# Patient Record
Sex: Female | Born: 1940
Health system: Southern US, Community
[De-identification: ages and names within clinical notes are randomized; demographics above are authoritative.]

## PROBLEM LIST (undated history)

## (undated) DIAGNOSIS — E785 Hyperlipidemia, unspecified: Secondary | ICD-10-CM

## (undated) DIAGNOSIS — I493 Ventricular premature depolarization: Secondary | ICD-10-CM

## (undated) DIAGNOSIS — K219 Gastro-esophageal reflux disease without esophagitis: Secondary | ICD-10-CM

## (undated) DIAGNOSIS — R9431 Abnormal electrocardiogram [ECG] [EKG]: Secondary | ICD-10-CM

## (undated) DIAGNOSIS — M8580 Other specified disorders of bone density and structure, unspecified site: Secondary | ICD-10-CM

## (undated) DIAGNOSIS — J45909 Unspecified asthma, uncomplicated: Secondary | ICD-10-CM

## (undated) DIAGNOSIS — I452 Bifascicular block: Secondary | ICD-10-CM

## (undated) DIAGNOSIS — G4700 Insomnia, unspecified: Secondary | ICD-10-CM

## (undated) DIAGNOSIS — F419 Anxiety disorder, unspecified: Secondary | ICD-10-CM

## (undated) HISTORY — DX: Insomnia, unspecified: G47.00

## (undated) HISTORY — DX: Ventricular premature depolarization: I49.3

## (undated) HISTORY — DX: Unspecified asthma, uncomplicated: J45.909

## (undated) HISTORY — DX: Anxiety disorder, unspecified: F41.9

## (undated) HISTORY — DX: Other specified disorders of bone density and structure, unspecified site: M85.80

## (undated) HISTORY — DX: Abnormal electrocardiogram (ECG) (EKG): R94.31

## (undated) HISTORY — DX: Gastro-esophageal reflux disease without esophagitis: K21.9

## (undated) HISTORY — DX: Hyperlipidemia, unspecified: E78.5

## (undated) HISTORY — DX: Bifascicular block: I45.2

## (undated) HISTORY — PX: TUBAL LIGATION: SHX77

---

## 2004-09-21 ENCOUNTER — Other Ambulatory Visit: Admission: RE | Admit: 2004-09-21 | Discharge: 2004-09-21 | Payer: Self-pay | Admitting: Family Medicine

## 2004-09-24 ENCOUNTER — Ambulatory Visit (HOSPITAL_COMMUNITY): Admission: RE | Admit: 2004-09-24 | Discharge: 2004-09-24 | Payer: Self-pay | Admitting: Family Medicine

## 2006-06-05 ENCOUNTER — Other Ambulatory Visit: Admission: RE | Admit: 2006-06-05 | Discharge: 2006-06-05 | Payer: Self-pay | Admitting: Family Medicine

## 2007-08-06 ENCOUNTER — Other Ambulatory Visit: Admission: RE | Admit: 2007-08-06 | Discharge: 2007-08-06 | Payer: Self-pay | Admitting: Family Medicine

## 2009-09-23 ENCOUNTER — Other Ambulatory Visit: Admission: RE | Admit: 2009-09-23 | Discharge: 2009-09-23 | Payer: Self-pay | Admitting: Family Medicine

## 2010-09-25 ENCOUNTER — Encounter: Payer: Self-pay | Admitting: Family Medicine

## 2011-09-17 DIAGNOSIS — Z23 Encounter for immunization: Secondary | ICD-10-CM | POA: Diagnosis not present

## 2011-09-20 DIAGNOSIS — Z1231 Encounter for screening mammogram for malignant neoplasm of breast: Secondary | ICD-10-CM | POA: Diagnosis not present

## 2012-09-17 DIAGNOSIS — M810 Age-related osteoporosis without current pathological fracture: Secondary | ICD-10-CM | POA: Diagnosis not present

## 2012-09-17 DIAGNOSIS — Z23 Encounter for immunization: Secondary | ICD-10-CM | POA: Diagnosis not present

## 2012-09-17 DIAGNOSIS — K589 Irritable bowel syndrome without diarrhea: Secondary | ICD-10-CM | POA: Diagnosis not present

## 2012-09-17 DIAGNOSIS — F411 Generalized anxiety disorder: Secondary | ICD-10-CM | POA: Diagnosis not present

## 2012-09-17 DIAGNOSIS — I4949 Other premature depolarization: Secondary | ICD-10-CM | POA: Diagnosis not present

## 2012-09-17 DIAGNOSIS — Z0000 Encounter for general adult medical examination without abnormal findings: Secondary | ICD-10-CM | POA: Diagnosis not present

## 2012-09-17 DIAGNOSIS — E7800 Pure hypercholesterolemia, unspecified: Secondary | ICD-10-CM | POA: Diagnosis not present

## 2012-09-18 DIAGNOSIS — M810 Age-related osteoporosis without current pathological fracture: Secondary | ICD-10-CM | POA: Diagnosis not present

## 2012-09-21 DIAGNOSIS — Z1231 Encounter for screening mammogram for malignant neoplasm of breast: Secondary | ICD-10-CM | POA: Diagnosis not present

## 2013-07-26 DIAGNOSIS — R002 Palpitations: Secondary | ICD-10-CM | POA: Diagnosis not present

## 2013-08-09 ENCOUNTER — Encounter: Payer: Self-pay | Admitting: Cardiology

## 2013-08-21 ENCOUNTER — Encounter: Payer: Self-pay | Admitting: Cardiology

## 2013-08-21 ENCOUNTER — Ambulatory Visit (INDEPENDENT_AMBULATORY_CARE_PROVIDER_SITE_OTHER): Payer: Medicare Other | Admitting: Cardiology

## 2013-08-21 VITALS — BP 125/50 | HR 82 | Ht 66.5 in | Wt 133.8 lb

## 2013-08-21 DIAGNOSIS — E785 Hyperlipidemia, unspecified: Secondary | ICD-10-CM | POA: Insufficient documentation

## 2013-08-21 DIAGNOSIS — I493 Ventricular premature depolarization: Secondary | ICD-10-CM | POA: Insufficient documentation

## 2013-08-21 DIAGNOSIS — I452 Bifascicular block: Secondary | ICD-10-CM

## 2013-08-21 DIAGNOSIS — F411 Generalized anxiety disorder: Secondary | ICD-10-CM | POA: Diagnosis not present

## 2013-08-21 DIAGNOSIS — I4949 Other premature depolarization: Secondary | ICD-10-CM | POA: Diagnosis not present

## 2013-08-21 DIAGNOSIS — F419 Anxiety disorder, unspecified: Secondary | ICD-10-CM

## 2013-08-21 MED ORDER — DILTIAZEM HCL 30 MG PO TABS: 30.0000 mg | ORAL_TABLET | Freq: Four times a day (QID) | ORAL | Status: DC | PRN

## 2013-08-21 NOTE — Progress Notes (Signed)
[address] [place], [place]  [address] Phone: [phone] Fax:  [phone]  Date:  08/21/2013   ID:  Kirsten Nash, DOB 05-09-1941, MRN [id number]  PCP:  Astrid Divine, MD   History of Present Illness: Kirsten Nash is a 72 y.o. female formally seen in 2011 here for evaluation of palpitations. She's been having more frequent palpitations which are occurring fairly constantly with no associated chest pain or syncope. In the past, she was worked up and diagnosed with PVCs and was on low-dose metoprolol. She stopped this medication because she could not tolerate it do to drop in her blood pressure.  Previous EKG demonstrated right bundle branch block, left anterior fascicular block, sinus rhythm. PVCs were noted. Previous echo/nuclear stress test were unremarkable. Stress test was done in December 2007.  Mother had myocardial infarction at age 67. Still living at 91.    Wt Readings from Last 3 Encounters:  08/21/13 133 lb 12.8 oz (60.691 kg)     Past Medical History  Diagnosis Date  . History of IBS   . Anxiety   . Insomnia   . Hyperlipidemia   . Osteopenia   . GERD (gastroesophageal reflux disease)   . PVC's (premature ventricular contractions)   . Allergy-induced asthma   . Abnormal EKG     ,normal cardiolyte   . RBBB (right bundle branch block with left anterior fascicular block)     No past surgical history on file.  Current Outpatient Prescriptions  Medication Sig Dispense Refill  . CALCIUM PO Take by mouth.      . Cholecalciferol (VITAMIN D PO) Take by mouth.      Marland Kitchen LORazepam (ATIVAN) 0.5 MG tablet Take 0.5 mg by mouth every 8 (eight) hours.      . Multiple Vitamin (MULTIVITAMIN) tablet Take 1 tablet by mouth daily.      . Red Yeast Rice Extract (RED YEAST RICE PO) Take by mouth.      . zolpidem (AMBIEN) 5 MG tablet Take 5 mg by mouth at bedtime as needed for sleep.       No current facility-administered medications  for this visit.    Allergies:    Allergies  Allergen Reactions  . Codeine Nausea Only    Social History:  The patient  reports that she has never smoked. She does not have any smokeless tobacco history on file. She reports that she drinks alcohol. She reports that she does not use illicit drugs.   Family History  Problem Relation Age of Onset  . Heart attack Mother 61    In her early 63's    ROS:  Please see the history of present illness.   Denies any fevers, syncope, bleeding, orthopnea, PND, rash.   All other systems reviewed and negative.   PHYSICAL EXAM: VS:  BP 125/50  Pulse 82  Ht 5' 6.5" (1.689 m)  Wt 133 lb 12.8 oz (60.691 kg)  BMI 21.27 kg/m2 Well nourished, well developed, in no acute distress HEENT: normal, [place]/AT, EOMI Neck: no JVD, normal carotid upstroke, no bruit Cardiac:  normal S1, S2; RRR; no murmur Lungs:  clear to auscultation bilaterally, no wheezing, rhonchi or rales Abd: soft, nontender, no hepatomegaly, no bruits Ext: no edema, 2+ distal pulses Skin: warm and dry GU: deferred Neuro: no focal abnormalities noted, AAO x 3  EKG:  Incomplete Right bundle branch block, left anterior fascicular block, sinus rhythm, 82bpm  ASSESSMENT AND PLAN:  1. Palpitations/right bundle branch block/left anterior fascicular block-in the past she has not tolerate metoprolol due to hypotension. Seems to skip every four or five beats. More common in the evening. Trying to stay away from caffiene. No CP. No Syncope. Most likely PVCs as previously diagnosed. She is concerned because her family member had a stroke and she had atrial fibrillation. I will check a 24-hour Holter monitor to ensure that there no adverse arrhythmias. We will see her back in 2 months after initiating medication. She can take diltiazem 30 mg by mouth every 6 hours, short acting. She is going to be having blood work done in January for yearly physical with Dr. Valentina Lucks.  Signed, Donato Schultz, MD [facility]    08/21/2013 10:21 AM

## 2013-08-21 NOTE — Patient Instructions (Signed)
Your physician has recommended you make the following change in your medication:   1. Start Diltiazem HCL 30 mg  Every 6 hours as needed for palpitations.  Your physician has recommended that you wear a 24 hr. holter monitor. Holter monitors are medical devices that record the heart's electrical activity. Doctors most often use these monitors to diagnose arrhythmias. Arrhythmias are problems with the speed or rhythm of the heartbeat. The monitor is a small, portable device. You can wear one while you do your normal daily activities. This is usually used to diagnose what is causing palpitations/syncope (passing out).  Your physician recommends that you schedule a follow-up appointment in: 2 months with Dr. Anne Fu.

## 2013-08-26 ENCOUNTER — Encounter (INDEPENDENT_AMBULATORY_CARE_PROVIDER_SITE_OTHER): Payer: Medicare Other

## 2013-08-26 ENCOUNTER — Encounter: Payer: Self-pay | Admitting: *Deleted

## 2013-08-26 DIAGNOSIS — R002 Palpitations: Secondary | ICD-10-CM

## 2013-08-26 DIAGNOSIS — I493 Ventricular premature depolarization: Secondary | ICD-10-CM

## 2013-08-26 NOTE — Progress Notes (Signed)
Patient ID: Kirsten Nash, female   DOB: [birth year], 72 y.o.   MRN: [id number] E-Cardio 24 hour holter monitor applied to patient.

## 2013-09-13 ENCOUNTER — Telehealth: Payer: Self-pay | Admitting: Cardiology

## 2013-09-13 NOTE — Telephone Encounter (Signed)
Monitor results. 

## 2013-09-13 NOTE — Telephone Encounter (Signed)
OK to stop diltiazem if it is causing HA. Let's restart metoprolol ER 25mg  PO QD. She does not need to keep 2/15 appt. Sched a 1 year follow up.

## 2013-09-18 ENCOUNTER — Telehealth: Payer: Self-pay | Admitting: Cardiology

## 2013-09-18 MED ORDER — METOPROLOL SUCCINATE ER 25 MG PO TB24: 25.0000 mg | ORAL_TABLET | Freq: Every day | ORAL | Status: DC

## 2013-09-18 NOTE — Telephone Encounter (Signed)
error 

## 2013-09-20 DIAGNOSIS — F411 Generalized anxiety disorder: Secondary | ICD-10-CM | POA: Diagnosis not present

## 2013-09-20 DIAGNOSIS — Z0000 Encounter for general adult medical examination without abnormal findings: Secondary | ICD-10-CM | POA: Diagnosis not present

## 2013-09-20 DIAGNOSIS — I4949 Other premature depolarization: Secondary | ICD-10-CM | POA: Diagnosis not present

## 2013-09-20 DIAGNOSIS — E7800 Pure hypercholesterolemia, unspecified: Secondary | ICD-10-CM | POA: Diagnosis not present

## 2013-09-20 DIAGNOSIS — M810 Age-related osteoporosis without current pathological fracture: Secondary | ICD-10-CM | POA: Diagnosis not present

## 2013-09-20 DIAGNOSIS — K589 Irritable bowel syndrome without diarrhea: Secondary | ICD-10-CM | POA: Diagnosis not present

## 2013-09-23 DIAGNOSIS — Z1231 Encounter for screening mammogram for malignant neoplasm of breast: Secondary | ICD-10-CM | POA: Diagnosis not present

## 2013-10-15 ENCOUNTER — Telehealth: Payer: Self-pay | Admitting: Cardiology

## 2013-10-15 NOTE — Telephone Encounter (Signed)
New message           Pt would like to know if she still needs to come in to be seen on 10/22/13? Thanks

## 2013-10-22 ENCOUNTER — Ambulatory Visit: Payer: Medicare Other | Admitting: Cardiology

## 2013-11-01 ENCOUNTER — Ambulatory Visit: Payer: Medicare Other | Admitting: Cardiology

## 2013-11-13 DIAGNOSIS — N952 Postmenopausal atrophic vaginitis: Secondary | ICD-10-CM | POA: Diagnosis not present

## 2013-11-13 DIAGNOSIS — N950 Postmenopausal bleeding: Secondary | ICD-10-CM | POA: Diagnosis not present

## 2013-11-19 DIAGNOSIS — N950 Postmenopausal bleeding: Secondary | ICD-10-CM | POA: Diagnosis not present

## 2013-11-23 ENCOUNTER — Encounter: Payer: Self-pay | Admitting: *Deleted

## 2014-03-05 DIAGNOSIS — H47329 Drusen of optic disc, unspecified eye: Secondary | ICD-10-CM | POA: Diagnosis not present

## 2014-03-05 DIAGNOSIS — H524 Presbyopia: Secondary | ICD-10-CM | POA: Diagnosis not present

## 2014-07-25 DIAGNOSIS — L821 Other seborrheic keratosis: Secondary | ICD-10-CM | POA: Diagnosis not present

## 2014-07-25 DIAGNOSIS — Z419 Encounter for procedure for purposes other than remedying health state, unspecified: Secondary | ICD-10-CM | POA: Diagnosis not present

## 2014-07-25 DIAGNOSIS — L570 Actinic keratosis: Secondary | ICD-10-CM | POA: Diagnosis not present

## 2014-07-28 ENCOUNTER — Emergency Department (HOSPITAL_COMMUNITY): Payer: Medicare Other

## 2014-07-28 ENCOUNTER — Encounter (HOSPITAL_COMMUNITY): Payer: Self-pay

## 2014-07-28 ENCOUNTER — Emergency Department (HOSPITAL_COMMUNITY)
Admission: EM | Admit: 2014-07-28 | Discharge: 2014-07-29 | Disposition: A | Discharge status: Home / Self Care | Payer: Medicare Other | Attending: Emergency Medicine | Admitting: Emergency Medicine

## 2014-07-28 DIAGNOSIS — R55 Syncope and collapse: Secondary | ICD-10-CM | POA: Insufficient documentation

## 2014-07-28 DIAGNOSIS — Z8719 Personal history of other diseases of the digestive system: Secondary | ICD-10-CM | POA: Insufficient documentation

## 2014-07-28 DIAGNOSIS — E785 Hyperlipidemia, unspecified: Secondary | ICD-10-CM | POA: Insufficient documentation

## 2014-07-28 DIAGNOSIS — Z7982 Long term (current) use of aspirin: Secondary | ICD-10-CM | POA: Diagnosis not present

## 2014-07-28 DIAGNOSIS — I499 Cardiac arrhythmia, unspecified: Secondary | ICD-10-CM | POA: Diagnosis not present

## 2014-07-28 DIAGNOSIS — Z79899 Other long term (current) drug therapy: Secondary | ICD-10-CM | POA: Diagnosis not present

## 2014-07-28 DIAGNOSIS — F419 Anxiety disorder, unspecified: Secondary | ICD-10-CM | POA: Diagnosis not present

## 2014-07-28 DIAGNOSIS — M8580 Other specified disorders of bone density and structure, unspecified site: Secondary | ICD-10-CM | POA: Diagnosis not present

## 2014-07-28 DIAGNOSIS — G4700 Insomnia, unspecified: Secondary | ICD-10-CM | POA: Insufficient documentation

## 2014-07-28 DIAGNOSIS — J45909 Unspecified asthma, uncomplicated: Secondary | ICD-10-CM | POA: Diagnosis not present

## 2014-07-28 DIAGNOSIS — R002 Palpitations: Secondary | ICD-10-CM | POA: Diagnosis not present

## 2014-07-28 LAB — BASIC METABOLIC PANEL
Anion gap: 11 (ref 5–15)
BUN: 20 mg/dL (ref 6–23)
CALCIUM: 9.3 mg/dL (ref 8.4–10.5)
CO2: 25 mEq/L (ref 19–32)
Chloride: 104 mEq/L (ref 96–112)
Creatinine, Ser: 0.95 mg/dL (ref 0.50–1.10)
GFR, EST AFRICAN AMERICAN: 68 mL/min — AB (ref >90)
GFR, EST NON AFRICAN AMERICAN: 59 mL/min — AB (ref >90)
GLUCOSE: 133 mg/dL — AB (ref 70–99)
POTASSIUM: 4.2 meq/L (ref 3.7–5.3)
SODIUM: 140 meq/L (ref 137–147)

## 2014-07-28 LAB — CBC
HCT: 39.6 % (ref 36.0–46.0)
Hemoglobin: 13.3 g/dL (ref 12.0–15.0)
MCH: 31.3 pg (ref 26.0–34.0)
MCHC: 33.6 g/dL (ref 30.0–36.0)
MCV: 93.2 fL (ref 78.0–100.0)
PLATELETS: 169 10*3/uL (ref 150–400)
RBC: 4.25 MIL/uL (ref 3.87–5.11)
RDW: 11.6 % (ref 11.5–15.5)
WBC: 9 10*3/uL (ref 4.0–10.5)

## 2014-07-28 LAB — I-STAT TROPONIN, ED: TROPONIN I, POC: 0.02 ng/mL (ref 0.00–0.08)

## 2014-07-28 LAB — TROPONIN I

## 2014-07-28 LAB — TSH: TSH: 2.4 u[IU]/mL (ref 0.350–4.500)

## 2014-07-28 LAB — MAGNESIUM: Magnesium: 2.1 mg/dL (ref 1.5–2.5)

## 2014-07-28 NOTE — ED Notes (Signed)
Pt brought in EMS for irregular heartbeat.  Pt reports she has a history of PVC's and is currently on Lopressor for them.  Pt reports at around 1500 she had a syncopal episode with n/v.  Pt denies any chest pain or SHOB.

## 2014-07-28 NOTE — Discharge Instructions (Signed)
Recall that if the cardiology office does not contact in the morning you should all to be seen for further evaluation and management of tonight's episode.  Holter Monitoring A Holter monitor is a small device with electrodes (small sticky patches) that attach to your chest. It records the electrical activity of your heart and is worn continuously for 24-48 hours.  A HOLTER MONITOR IS USED TO  Detect heart problems such as:  Heart arrhythmia. Is an abnormal or irregular heartbeat. With some heart arrhythmias, you may not feel or know that you have an irregular heart rhythm.  Palpitations, such as feeling your heart racing or fluttering. It is possible to have heart palpitations and not have a heart arrhythmia.  A heart rhythm that is too slow or too fast.  If you have problems fainting, near fainting or feeling light-headed, a Holter monitor may be worn to see if your heart is the cause. HOLTER MONITOR PREPARATION   Electrodes will be attached to the skin on your chest.  If you have hair on your chest, small areas may have to be shaved. This is done to help the patches stick better and make the recording more accurate.  The electrodes are attached by wires to the Holter monitor. The Holter monitor clips to your clothing. You will wear the monitor at all times, even while exercising and sleeping. HOME CARE INSTRUCTIONS   Wear your monitor at all times.  The wires and the monitor must stay dry. Do not get the monitor wet.  Do not bathe, swim or use a hot tub with it on.  You may do a "sponge" bath while you have the monitor on.  Keep your skin clean, do not put body lotion or moisturizer on your chest.  It's possible that your skin under the electrodes could become irritated. To keep this from happening, you may put the electrodes in slightly different places on your chest.  Your caregiver will also ask you to keep a diary of your activities, such as walking or doing chores. Be sure  to note what you are doing if you experience heart symptoms such as palpitations. This will help your caregiver determine what might be contributing to your symptoms. The information stored in your monitor will be reviewed by your caregiver alongside your diary entries.  Make sure the monitor is safely clipped to your clothing or in a location close to your body that your caregiver recommends.  The monitor and electrodes are removed when the test is over. Return the monitor as directed.  Be sure to follow up with your caregiver and discuss your Holter monitor results. SEEK IMMEDIATE MEDICAL CARE IF:  You faint or feel lightheaded.  You have trouble breathing.  You get pain in your chest, upper arm or jaw.  You feel sick to your stomach and your skin is pale, cool, or damp.  You think something is wrong with the way your heart is beating. MAKE SURE YOU:   Understand these instructions.  Will watch your condition.  Will get help right away if you are not doing well or get worse. Document Released: 05/20/2004 Document Revised: 11/14/2011 Document Reviewed: 10/02/2008 [facility] Patient Information 2015 [place], [place]. This information is not intended to replace advice given to you by your health care provider. Make sure you discuss any questions you have with your health care provider.  Near-Syncope Near-syncope (commonly known as near fainting) is sudden weakness, dizziness, or feeling like you might pass out. During an  episode of near-syncope, you may also develop pale skin, have tunnel vision, or feel sick to your stomach (nauseous). Near-syncope may occur when getting up after sitting or while standing for a long time. It is caused by a sudden decrease in blood flow to the brain. This decrease can result from various causes or triggers, most of which are not serious. However, because near-syncope can sometimes be a sign of something serious, a medical evaluation is required. The specific  cause is often not determined. HOME CARE INSTRUCTIONS  Monitor your condition for any changes. The following actions may help to alleviate any discomfort you are experiencing:  Have someone stay with you until you feel stable.  Lie down right away and prop your feet up if you start feeling like you might faint. Breathe deeply and steadily. Wait until all the symptoms have passed. Most of these episodes last only a few minutes. You may feel tired for several hours.   Drink enough fluids to keep your urine clear or pale yellow.   If you are taking blood pressure or heart medicine, get up slowly when seated or lying down. Take several minutes to sit and then stand. This can reduce dizziness.  Follow up with your health care provider as directed. SEEK IMMEDIATE MEDICAL CARE IF:   You have a severe headache.   You have unusual pain in the chest, abdomen, or back.   You are bleeding from the mouth or rectum, or you have black or tarry stool.   You have an irregular or very fast heartbeat.   You have repeated fainting or have seizure-like jerking during an episode.   You faint when sitting or lying down.   You have confusion.   You have difficulty walking.   You have severe weakness.   You have vision problems.  MAKE SURE YOU:   Understand these instructions.  Will watch your condition.  Will get help right away if you are not doing well or get worse. Document Released: 08/22/2005 Document Revised: 08/27/2013 Document Reviewed: 01/25/2013 [facility] Patient Information 2015 [place], [place]. This information is not intended to replace advice given to you by your health care provider. Make sure you discuss any questions you have with your health care provider.  Return here for any concerning changes in your condition.

## 2014-07-28 NOTE — ED Provider Notes (Signed)
CSN: [id number]     Arrival date & time 07/28/14  2052 History   First MD Initiated Contact with Patient 07/28/14 2057     Chief Complaint  Patient presents with  . Irregular Heart Beat     HPI  Patient presents after an episode of near-syncope.  She states that just prior to arrival she was standing up, felt lightheaded without pain, and lower esophagus ground due to lightheadedness. She had no pain when her symptoms resolved, and currently she has minimal complaints. She only complains of mild"off"sensation in her head. No ongoing visual loss, neck pain, nausea, confusion. Patient notes a history of palpitations, PVC, has worn Holter monitor in the past. No Hx of ischemic heart disease.   Past Medical History  Diagnosis Date  . History of IBS   . Anxiety   . Insomnia   . Hyperlipidemia   . Osteopenia   . GERD (gastroesophageal reflux disease)   . PVC's (premature ventricular contractions)   . Allergy-induced asthma   . Abnormal EKG     ,normal cardiolyte   . RBBB (right bundle branch block with left anterior fascicular block)    History reviewed. No pertinent past surgical history. Family History  Problem Relation Age of Onset  . Heart attack Mother 19    In her early 12's   History  Substance Use Topics  . Smoking status: Never Smoker   . Smokeless tobacco: Not on file  . Alcohol Use: Yes     Comment: Occassionally   OB History    No data available     Review of Systems  Constitutional:       Per HPI, otherwise negative  HENT:       Per HPI, otherwise negative  Respiratory:       Per HPI, otherwise negative  Cardiovascular:       Per HPI, otherwise negative  Gastrointestinal: Negative for vomiting.  Endocrine:       Negative aside from HPI  Genitourinary:       Neg aside from HPI   Musculoskeletal:       Per HPI, otherwise negative  Skin: Negative.   Neurological: Positive for light-headedness. Negative for syncope.      Allergies   Codeine  Home Medications   Prior to Admission medications   Medication Sig Start Date End Date Taking? Authorizing Provider  anti-nausea (EMETROL) solution Take 15 mLs by mouth every 15 (fifteen) minutes as needed for nausea or vomiting.   Yes Historical Provider, MD  aspirin EC 81 MG tablet Take 81 mg by mouth daily.   Yes Historical Provider, MD  CALCIUM PO Take 2,000 mg by mouth daily.    Yes Historical Provider, MD  Cholecalciferol (VITAMIN D PO) Take 1 tablet by mouth daily.    Yes Historical Provider, MD  ibuprofen (ADVIL,MOTRIN) 200 MG tablet Take 400 mg by mouth every 6 (six) hours as needed for moderate pain.   Yes Historical Provider, MD  loratadine (CLARITIN) 10 MG tablet Take 10 mg by mouth daily as needed for allergies.   Yes Historical Provider, MD  LORazepam (ATIVAN) 0.5 MG tablet Take 0.5 mg by mouth as needed for anxiety.    Yes Historical Provider, MD  metoprolol tartrate (LOPRESSOR) 25 MG tablet Take 25 mg by mouth 2 (two) times daily.   Yes Historical Provider, MD  Multiple Vitamin (MULTIVITAMIN) tablet Take 1 tablet by mouth daily.   Yes Historical Provider, MD  Red Yeast Rice Extract (RED  YEAST RICE PO) Take 1,200 mg by mouth daily.    Yes Historical Provider, MD  zolpidem (AMBIEN) 5 MG tablet Take 2.5-5 mg by mouth at bedtime as needed for sleep.    Yes Historical Provider, MD  metoprolol succinate (TOPROL XL) 25 MG 24 hr tablet Take 1 tablet (25 mg total) by mouth daily. 09/18/13   Candee Furbish, MD   BP 119/48 mmHg  Pulse 65  Resp 17  Ht 5\' 6"  (1.676 m)  Wt 128 lb (58.06 kg)  BMI 20.67 kg/m2  SpO2 97% Physical Exam  Constitutional: She is oriented to person, place, and time. She appears well-developed and well-nourished. No distress.  HENT:  Head: Normocephalic and atraumatic.  Eyes: Conjunctivae and EOM are normal.  Cardiovascular: Normal rate, regular rhythm and intact distal pulses.   Pulmonary/Chest: Effort normal and breath sounds normal. No stridor. No  respiratory distress.  Abdominal: She exhibits no distension.  Musculoskeletal: She exhibits no edema.  Neurological: She is alert and oriented to person, place, and time. She displays no atrophy and no tremor. No cranial nerve deficit or sensory deficit. She exhibits normal muscle tone. She displays no seizure activity. Coordination normal.  Skin: Skin is warm and dry.  Psychiatric: She has a normal mood and affect.  Nursing note and vitals reviewed.   ED Course  Procedures (including critical care time) Labs Review Labs Reviewed  BASIC METABOLIC PANEL - Abnormal; Notable for the following:    Glucose, Bld 133 (*)    GFR calc non Af Amer 59 (*)    GFR calc Af Amer 68 (*)    All other components within normal limits  CBC  TROPONIN I  MAGNESIUM  TSH  I-STAT TROPOININ, ED    Imaging Review Dg Chest 2 View  07/28/2014   CLINICAL DATA:  Syncope.  History of P VC.  EXAM: CHEST  2 VIEW  COMPARISON:  None.  FINDINGS: Heart size is normal. Calcified plaque noted within the aortic arch. No pleural effusion or edema. The lungs are hyperinflated and there are coarsened interstitial markings noted bilaterally. No airspace consolidation identified. The visualized osseous structures are unremarkable.  IMPRESSION: No active cardiopulmonary disease.  Atherosclerosis noted   Electronically Signed   By: Kerby Moors M.D.   On: 07/28/2014 23:25     Date: 07/28/2014  Rate: 80  Rhythm: normal sinus rhythm  QRS Axis: left  Intervals: normal  ST/T Wave abnormalities: normal  Conduction Disutrbances:bigeminy, PVC  Narrative Interpretation:   Old EKG Reviewed: none available  12:00 AM Patient in no distress.  I discussed all findings with her at length.  We discussed overnight observation for monitoring versus neck a follow-up with cardiology via telephone. I discussed patient's case with cardiology, and the patient will receive a phone call from the office tomorrow morning. The patient  requested discharge with outpatient follow-up.  Given the absence of ongoing complaints, and the reassuring findings, she was discharged per her request.  MDM   Final diagnoses:  Syncope    Patient presents after an episode of syncope versus near syncope. On the patient's evaluation in the emergency room and she is awake, alert, neurologically intact, hemodynamically stable,    with PVCs and bigeminy on monitor, which is typical for her. with reassuring findings, no evidence for ischemia, the patient was discharged with close outpatient follow-up for further cardiac monitoring   Carmin Muskrat, MD 07/29/14 0001

## 2014-07-29 ENCOUNTER — Telehealth: Payer: Self-pay | Admitting: Cardiology

## 2014-07-29 NOTE — Telephone Encounter (Signed)
New Message         Pt calling stating she was seen in ER last night and told to follow up with Dr. Marlou Porch in 1 day. I look at pt's chart and it says to call our office in 1 day to schedule an appt to f/u w/ Dr. Marlou Porch but it doesn't say she needs to be seen today. Pt is insisting on being seen today. Please call pt back and advise.

## 2014-07-29 NOTE — Telephone Encounter (Signed)
Called stating she "passed" out yesterday and when she "woke" up she began vomiting.  States she doesn't know how long she was out.  States she was aware but couldn't function.  States she was by herself so she called her daughter to come over.  This happened about 3:00 yesterday. States after daughter came over she went to bed for awhile and then when got up still felt bad so went to ER about 8:00 last night.  States the ER doctor told her to call our office today and be seen.  According to ER note she was to be seen with in the week.  States she still doesn't feel good today and is very weak. No further "passing" out episodes.  She insists on being seen.  Was last seen by Dr. Marlou Porch in Dec 2014. Made her an app to see Sharrell Ku tomorrow at 2:30.  She understands and will be here tomorrow.

## 2014-07-30 ENCOUNTER — Ambulatory Visit (INDEPENDENT_AMBULATORY_CARE_PROVIDER_SITE_OTHER): Payer: Medicare Other | Admitting: Physician Assistant

## 2014-07-30 ENCOUNTER — Encounter: Payer: Self-pay | Admitting: Physician Assistant

## 2014-07-30 VITALS — BP 116/60 | HR 85 | Ht 66.5 in | Wt 134.4 lb

## 2014-07-30 DIAGNOSIS — I452 Bifascicular block: Secondary | ICD-10-CM

## 2014-07-30 DIAGNOSIS — E785 Hyperlipidemia, unspecified: Secondary | ICD-10-CM

## 2014-07-30 DIAGNOSIS — R55 Syncope and collapse: Secondary | ICD-10-CM

## 2014-07-30 DIAGNOSIS — R739 Hyperglycemia, unspecified: Secondary | ICD-10-CM

## 2014-07-30 DIAGNOSIS — I493 Ventricular premature depolarization: Secondary | ICD-10-CM | POA: Diagnosis not present

## 2014-07-30 NOTE — Progress Notes (Signed)
[place], [place], [place]  [address] Phone: [phone] Fax:  [phone]  Date:  07/30/2014   Patient ID:  Kirsten Nash, DOB 1941-05-20, MRN [id number]   PCP:  Osborne Casco, MD  Cardiologist: Dr. Marlou Porch   History of Present Illness: Kirsten Nash is a 73 y.o. female with history of HLD, palpitations (Holter 08/2013: NSR/ST, occasional PVCs, rare PACs ), and incomplete RBBB/LAFB who was added on to flex clinic for evaluation of recent near-syncope. She has h/o PVCs going back to at least 2007. She is on metoprolol - in the past apparently she was unable to tolerate due to drop in BP, but has been more recently able to take it without difficulty. She did find that tartrate contributed to lower extremity edema but this resolved after changing to succinate. Per Dr. Marlou Porch' note from 08/2013, previous echo/nuclear stress test (08/2006) were unremarkable but I do not have those records.  She was in her usual state of health 07/28/14 after a day of shopping. She was standing still her kitchen for a couple minutes and suddenly felt very "funny." She has a difficult time describing the sensation, but felt like she knew she was going to pass out. Everything felt in slow motion. She was able to sit down and the feeling passed very gradually. Afterwards she vomited and had release of clear liquid from her bowels. She denies any diaphoresis, acute palpitations, chest pain, dyspnea, seizure activity, actual loss of consciousness or post-ictal state. She had her eyes closed so she is unsure of any visual changes. In the ER 11/23, labs showed Mg 2.1, troponin neg x 1, TSH wnl, BMET OK except glucose 133, CBC normal. CXR with plaque in aortic arch, no active CP disease. Tele reportedly with PVCs and bigeminy on the monitor. ED EKG: NSR 80bpm with intermittent ventricular bigeminy, RSR V1, incomplete RBBB (QRS 105) and LAFB. She was discharged and asked to follow-up  here.  She has never had an episode like this before. She denies any recurrent episodes.   Orthostatic vital signs in the office today were negative. No reproduction of near syncope. Lying - P 74, BP 109/66 Sitting - P 79, BP 115/65 Standing 19min - P 87, BP 114/70 Standing 81min - P 83, BP 119/68  Standing 62min - P 87, BP 106/71  Recent Labs: 07/28/2014: Creatinine 0.95; Hemoglobin 13.3; Potassium 4.2; TSH 2.400  Wt Readings from Last 3 Encounters:  07/30/14 134 lb 6.4 oz (60.963 kg)  07/28/14 128 lb (58.06 kg)  08/21/13 133 lb 12.8 oz (60.691 kg)     Past Medical History  Diagnosis Date  . Anxiety   . Insomnia   . Hyperlipidemia   . Osteopenia   . GERD (gastroesophageal reflux disease)   . PVC's (premature ventricular contractions)     a. Holter 08/2013: NSR, sinus tach, occasional PVCs, rare PACs.  . Allergy-induced asthma   . Abnormal EKG     ,normal cardiolyte   . 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 aspirin EC 81 MG tablet Take 81 mg by mouth daily.    Marland Kitchen CALCIUM PO Take 2,000 mg by mouth daily.     . Cholecalciferol (VITAMIN D PO) Take 1 tablet by mouth daily.     Marland Kitchen ibuprofen (ADVIL,MOTRIN) 200 MG tablet Take 400 mg by mouth every 6 (six) hours as needed for moderate  pain.    . loratadine (CLARITIN) 10 MG tablet Take 10 mg by mouth daily as needed for allergies.    Marland Kitchen LORazepam (ATIVAN) 0.5 MG tablet Take 0.5 mg by mouth as needed for anxiety.     . metoprolol succinate (TOPROL XL) 25 MG 24 hr tablet Take 1 tablet (25 mg total) by mouth daily. 30 tablet 5  . Multiple Vitamin (MULTIVITAMIN) tablet Take 1 tablet by mouth daily.    . Red Yeast Rice Extract (RED YEAST RICE PO) Take 1,200 mg by mouth daily.     Marland Kitchen zolpidem (AMBIEN) 5 MG tablet Take 2.5-5 mg by mouth at bedtime as needed for sleep.      No current facility-administered medications for this visit.     Allergies:   Codeine   Social History:  The patient  reports that she has never smoked. She does not have any smokeless tobacco history on file. She reports that she drinks alcohol. She reports that she does not use illicit drugs.   Family History:  The patient's family history includes Heart attack (age of onset: 82) in her mother.  ROS:  Please see the history of present illness.  Has felt somewhat fatigued since her incident. Some nausea without vomiting.  All other systems reviewed and negative.   PHYSICAL EXAM:  VS:  BP 116/60 mmHg  Pulse 85  Ht 5' 6.5" (1.689 m)  Wt 134 lb 6.4 oz (60.963 kg)  BMI 21.37 kg/m2 Well nourished, well developed WF in no acute distress HEENT: normal, no carotid bruits Neck: no JVD Cardiac:  normal S1, S2; RRR; no murmur Lungs:  clear to auscultation bilaterally, no wheezing, rhonchi or rales Abd: soft, nontender, no hepatomegaly Ext: no edema, 2+ pedal pulses Skin: warm and dry Neuro:  moves all extremities spontaneously, no focal abnormalities noted  EKG:  NSR 85bpm, incomplete RBBB (QRS 100), LAFB, no acute change from prior     ASSESSMENT AND PLAN:  1. Near-syncope - discussed case with Dr. Lovena Le (DOD). While difficult to exclude bradyarrhythmia, he believes the event sounds more vasovagal in nature. Recent lytes and TSH at time of the event were OK. Orthostatic VS were negative in the office today. She does have evidence of some underlying conduction disease with incomplete RBBB and LAFB. Will plan for 30-day event monitor along with 2D echocardiogram to further evaluate. I have asked her to avoid driving until the monitor has been reviewed.  2. Incomplete RBBB with LAFB - see above. 3. PVCs - these are chronic. Recent K 4.2, Mg 2.1. Metoprolol seems to help with the severity of symptoms from the PVCs but hasn't really decreased the number per her report. If workup is unrevealing we might consider trying her on a different beta blocker to see  if it helps. I don't want to make any changes today because I want her event monitor to reflect what she has been taking chronically. 4. Hyperlipidemia - followed by PCP. 5. Recent hyperglycemia - advised to f/u PCP.  Dispo: F/u 4-5 weeks after event monitor completion with either Dr. Marlou Porch or myself if possible, sooner if needed.  Signed, Melina Copa, PA-C  07/30/2014 3:05 PM

## 2014-07-30 NOTE — Patient Instructions (Signed)
Your physician recommends that you continue on your current medications as directed. Please refer to the Current Medication list given to you today.    Your physician has requested that you have an echocardiogram. Echocardiography is a painless test that uses sound waves to create images of your heart. It provides your doctor with information about the size and shape of your heart and how well your heart's chambers and valves are working. This procedure takes approximately one hour. There are no restrictions for this procedure.    Your physician has recommended that you wear an event monitor. Event monitors are medical devices that record the heart's electrical activity. Doctors most often [place] these monitors to diagnose arrhythmias. Arrhythmias are problems with the speed or rhythm of the heartbeat. The monitor is a small, portable device. You can wear one while you do your normal daily activities. This is usually used to diagnose what is causing palpitations/syncope (passing out).   Your physician recommends that you schedule a follow-up appointment in: in 4 to [place] BY REQUEST BY PATIENT

## 2014-08-04 ENCOUNTER — Encounter: Payer: Self-pay | Admitting: Physician Assistant

## 2014-08-04 NOTE — Telephone Encounter (Signed)
OK to have her stop metoprolol.  Candee Furbish, MD

## 2014-08-04 NOTE — Telephone Encounter (Signed)
Spoke with patient who is reporting she is having pressure in her head, nausea, anxiety and insomnia that she "knows" is coming from her Metoprolol.  She is taking this for PVC's and would like to know if she can decrease it or stop it.  She takes 25 mg once a day.  Aware I will forward information to Dr Marlou Porch for review and call her back with orders.

## 2014-08-06 ENCOUNTER — Encounter (INDEPENDENT_AMBULATORY_CARE_PROVIDER_SITE_OTHER): Payer: Medicare Other

## 2014-08-06 ENCOUNTER — Ambulatory Visit (HOSPITAL_COMMUNITY): Payer: Medicare Other | Attending: Cardiology | Admitting: Radiology

## 2014-08-06 ENCOUNTER — Encounter: Payer: Self-pay | Admitting: *Deleted

## 2014-08-06 DIAGNOSIS — I452 Bifascicular block: Secondary | ICD-10-CM

## 2014-08-06 DIAGNOSIS — R55 Syncope and collapse: Secondary | ICD-10-CM | POA: Diagnosis not present

## 2014-08-06 NOTE — Progress Notes (Signed)
Echocardiogram performed.  

## 2014-08-06 NOTE — Progress Notes (Signed)
Patient ID: Kirsten Nash, female   DOB: Jan 18, 1941, 73 y.o.   MRN: [id number] [employer] 30 day cardiac event monitor to be applied to patient.  Estimated EOS 09/06/2013.

## 2014-08-12 ENCOUNTER — Telehealth: Payer: Self-pay | Admitting: *Deleted

## 2014-08-12 NOTE — Telephone Encounter (Signed)
pt notified about echo results with verbal understanding 

## 2014-09-12 ENCOUNTER — Encounter: Payer: Self-pay | Admitting: Cardiology

## 2014-09-12 ENCOUNTER — Ambulatory Visit (INDEPENDENT_AMBULATORY_CARE_PROVIDER_SITE_OTHER): Payer: Medicare Other | Admitting: Cardiology

## 2014-09-12 VITALS — BP 110/70 | HR 89 | Ht 66.5 in | Wt 133.0 lb

## 2014-09-12 DIAGNOSIS — I493 Ventricular premature depolarization: Secondary | ICD-10-CM | POA: Diagnosis not present

## 2014-09-12 DIAGNOSIS — R55 Syncope and collapse: Secondary | ICD-10-CM | POA: Diagnosis not present

## 2014-09-12 NOTE — Progress Notes (Signed)
[place], [place], [place]  [address] Phone: [phone] Fax:  [phone]  Date:  09/12/2014   Patient ID:  Kirsten Nash, DOB 1940/10/25, MRN [id number]   PCP:  Osborne Casco, MD  Cardiologist: Dr. Marlou Porch   History of Present Illness: Kirsten Nash is a 74 y.o. female with history of HLD, palpitations (Holter 08/2013: NSR/ST, occasional PVCs, rare PACs ), and incomplete RBBB/LAFB who was added on to flex clinic previously seen by Dunn for evaluation of recent near-syncope. She has h/o PVCs going back to at least 2007. She has on metoprolol - in the past apparently she was unable to tolerate due to drop in BP, but has been more recently able to take it without difficulty. She did find that tartrate contributed to lower extremity edema but this resolved after changing to succinate. Previous echo/nuclear stress test (08/2006) were unremarkable but I do not have those records.  She was in her usual state of health 07/28/14 after a day of shopping. She was standing still her kitchen for a couple minutes and suddenly felt very "funny." She has a difficult time describing the sensation, but felt like she knew she was going to pass out. Everything felt in slow motion. Vision went black. She was able to sit down and the feeling passed very gradually. Afterwards she vomited and had release of clear liquid from her bowels. She denies any diaphoresis, acute palpitations, chest pain, dyspnea, seizure activity, actual loss of consciousness or post-ictal state. She had her eyes closed so she is unsure of any visual changes.  In the ER 11/23, labs showed Mg 2.1, troponin neg x 1, TSH wnl, BMET OK except glucose 133, CBC normal. CXR with plaque in aortic arch, no active CP disease. Tele reportedly with PVCs and bigeminy on the monitor. ED EKG: NSR 80bpm with intermittent ventricular bigeminy, RSR V1, incomplete RBBB (QRS 105) and LAFB. She was discharged and asked to follow-up  here.  She has never had an episode like this before. She denies any recurrent episodes. She has always has low BP.   Orthostatic vital signs in the office were negative. No reproduction of near syncope. Lying - P 74, BP 109/66 Sitting - P 79, BP 115/65 Standing 12min - P 87, BP 114/70 Standing 56min - P 83, BP 119/68  Standing 67min - P 87, BP 106/71  She wore event monitor 08/2014 which showed no adverse arrhythmias. PVCs. She feels a thud-like sensation in her head during PVCs. Sometimes feels dizziness.  Recent Labs: 07/28/2014: Creatinine 0.95; Hemoglobin 13.3; Potassium 4.2; TSH 2.400  Wt Readings from Last 3 Encounters:  09/12/14 133 lb (60.328 kg)  07/30/14 134 lb 6.4 oz (60.963 kg)  07/28/14 128 lb (58.06 kg)     Past Medical History  Diagnosis Date  . Anxiety   . Insomnia   . Hyperlipidemia   . Osteopenia   . GERD (gastroesophageal reflux disease)   . PVC's (premature ventricular contractions)     a. Holter 08/2013: NSR, sinus tach, occasional PVCs, rare PACs.  . Allergy-induced asthma   . Abnormal EKG     ,normal cardiolyte   . 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 aspirin EC 81 MG tablet Take 81 mg by mouth daily.    Marland Kitchen CALCIUM PO Take 2,000 mg by mouth daily.     . Cholecalciferol (VITAMIN D  PO) Take 1 tablet by mouth daily.     Marland Kitchen ibuprofen (ADVIL,MOTRIN) 200 MG tablet Take 400 mg by mouth every 6 (six) hours as needed for moderate pain.    Marland Kitchen loratadine (CLARITIN) 10 MG tablet Take 10 mg by mouth daily as needed for allergies.    Marland Kitchen LORazepam (ATIVAN) 0.5 MG tablet Take 0.5 mg by mouth as needed for anxiety.     . Multiple Vitamin (MULTIVITAMIN) tablet Take 1 tablet by mouth daily.    . Red Yeast Rice Extract (RED YEAST RICE PO) Take 1,200 mg by mouth daily.     Marland Kitchen zolpidem (AMBIEN) 5 MG tablet Take 2.5-5 mg by mouth at bedtime as needed for sleep.      No  current facility-administered medications for this visit.    Allergies:   Codeine   Social History:  The patient  reports that she has never smoked. She does not have any smokeless tobacco history on file. She reports that she drinks alcohol. She reports that she does not use illicit drugs.   Family History:  The patient's family history includes Heart attack (age of onset: 74) in her mother.  ROS:  Please see the history of present illness.  Has felt somewhat fatigued since her incident. Some nausea without vomiting.  All other systems reviewed and negative.   PHYSICAL EXAM:  VS:  BP 110/70 mmHg  Pulse 89  Ht 5' 6.5" (1.689 m)  Wt 133 lb (60.328 kg)  BMI 21.15 kg/m2 Well nourished, well developed WF in no acute distress HEENT: normal, no carotid bruits Neck: no JVD Cardiac:  normal S1, S2; RRR; no murmur Lungs:  clear to auscultation bilaterally, no wheezing, rhonchi or rales Abd: soft, nontender, no hepatomegaly Ext: no edema, 2+ pedal pulses Skin: warm and dry Neuro:  moves all extremities spontaneously, no focal abnormalities noted  EKG:  NSR 85bpm, incomplete RBBB (QRS 100), LAFB, no acute change from prior     ASSESSMENT AND PLAN:  1. Near-syncope - Dayna Dunn discussed case with Dr. Lovena Le (DOD). He believes the event sounds more vasovagal in nature. I agree, event monitor was reassuring. Recent lytes and TSH at time of the event were OK. Orthostatic VS were negative in the office today. She does have evidence of some underlying conduction disease with incomplete RBBB and LAFB. Discussed at length vasovagal physiology. I would like for her to liberalize her salt intake, increase fluids, exercise, compression hose. She knows to lay down if her symptoms start to occur. 2. Incomplete RBBB with LAFB  3. PVCs - these are chronic. Recent K 4.2, Mg 2.1. She has had difficulty in the past taking metoprolol. I did not wish to lower her blood pressure any more by giving her this  medication. Avoid caffeine. Conservative measures. 4. Hyperlipidemia - followed by PCP. 5. Recent hyperglycemia - advised to f/u PCP. One-year follow-up. Signed, Candee Furbish, MD  09/12/2014 4:17 PM

## 2014-09-12 NOTE — Patient Instructions (Addendum)
The current medical regimen is effective;  continue present plan and medications.  Please increase your fluid and salt intake.  Wear compression/support hose during the day.  You may remove them at night.  Follow up in 1 year with Dr. Marlou Porch.  You will receive a letter in the mail 2 months before you are due.  Please call us when you receive this letter to schedule your follow up appointment.

## 2014-10-08 DIAGNOSIS — E780 Pure hypercholesterolemia: Secondary | ICD-10-CM | POA: Diagnosis not present

## 2014-10-08 DIAGNOSIS — Z23 Encounter for immunization: Secondary | ICD-10-CM | POA: Diagnosis not present

## 2014-10-08 DIAGNOSIS — Z0000 Encounter for general adult medical examination without abnormal findings: Secondary | ICD-10-CM | POA: Diagnosis not present

## 2014-10-08 DIAGNOSIS — Z131 Encounter for screening for diabetes mellitus: Secondary | ICD-10-CM | POA: Diagnosis not present

## 2014-10-08 DIAGNOSIS — I493 Ventricular premature depolarization: Secondary | ICD-10-CM | POA: Diagnosis not present

## 2014-10-08 DIAGNOSIS — F39 Unspecified mood [affective] disorder: Secondary | ICD-10-CM | POA: Diagnosis not present

## 2014-10-08 DIAGNOSIS — M810 Age-related osteoporosis without current pathological fracture: Secondary | ICD-10-CM | POA: Diagnosis not present

## 2014-10-08 DIAGNOSIS — K580 Irritable bowel syndrome with diarrhea: Secondary | ICD-10-CM | POA: Diagnosis not present

## 2014-10-08 DIAGNOSIS — Z1389 Encounter for screening for other disorder: Secondary | ICD-10-CM | POA: Diagnosis not present

## 2014-10-29 DIAGNOSIS — M810 Age-related osteoporosis without current pathological fracture: Secondary | ICD-10-CM | POA: Diagnosis not present

## 2015-01-06 DIAGNOSIS — Z1231 Encounter for screening mammogram for malignant neoplasm of breast: Secondary | ICD-10-CM | POA: Diagnosis not present

## 2015-10-23 DIAGNOSIS — F39 Unspecified mood [affective] disorder: Secondary | ICD-10-CM | POA: Diagnosis not present

## 2015-10-23 DIAGNOSIS — K580 Irritable bowel syndrome with diarrhea: Secondary | ICD-10-CM | POA: Diagnosis not present

## 2015-10-23 DIAGNOSIS — M810 Age-related osteoporosis without current pathological fracture: Secondary | ICD-10-CM | POA: Diagnosis not present

## 2015-10-23 DIAGNOSIS — E7800 Pure hypercholesterolemia, unspecified: Secondary | ICD-10-CM | POA: Diagnosis not present

## 2015-10-23 DIAGNOSIS — Z0000 Encounter for general adult medical examination without abnormal findings: Secondary | ICD-10-CM | POA: Diagnosis not present

## 2015-10-23 DIAGNOSIS — Z1389 Encounter for screening for other disorder: Secondary | ICD-10-CM | POA: Diagnosis not present

## 2015-10-23 DIAGNOSIS — I493 Ventricular premature depolarization: Secondary | ICD-10-CM | POA: Diagnosis not present

## 2016-01-27 DIAGNOSIS — E7800 Pure hypercholesterolemia, unspecified: Secondary | ICD-10-CM | POA: Diagnosis not present

## 2016-01-27 DIAGNOSIS — Z79899 Other long term (current) drug therapy: Secondary | ICD-10-CM | POA: Diagnosis not present

## 2016-02-12 DIAGNOSIS — Z1211 Encounter for screening for malignant neoplasm of colon: Secondary | ICD-10-CM | POA: Diagnosis not present

## 2016-04-19 IMAGING — DX DG CHEST 2V
2 series · 2 of 2 positions shown · non-contrast
Comparison: None.

CLINICAL DATA: Syncope.  History of P VC.

EXAM:
CHEST  2 VIEW

[chest pa]
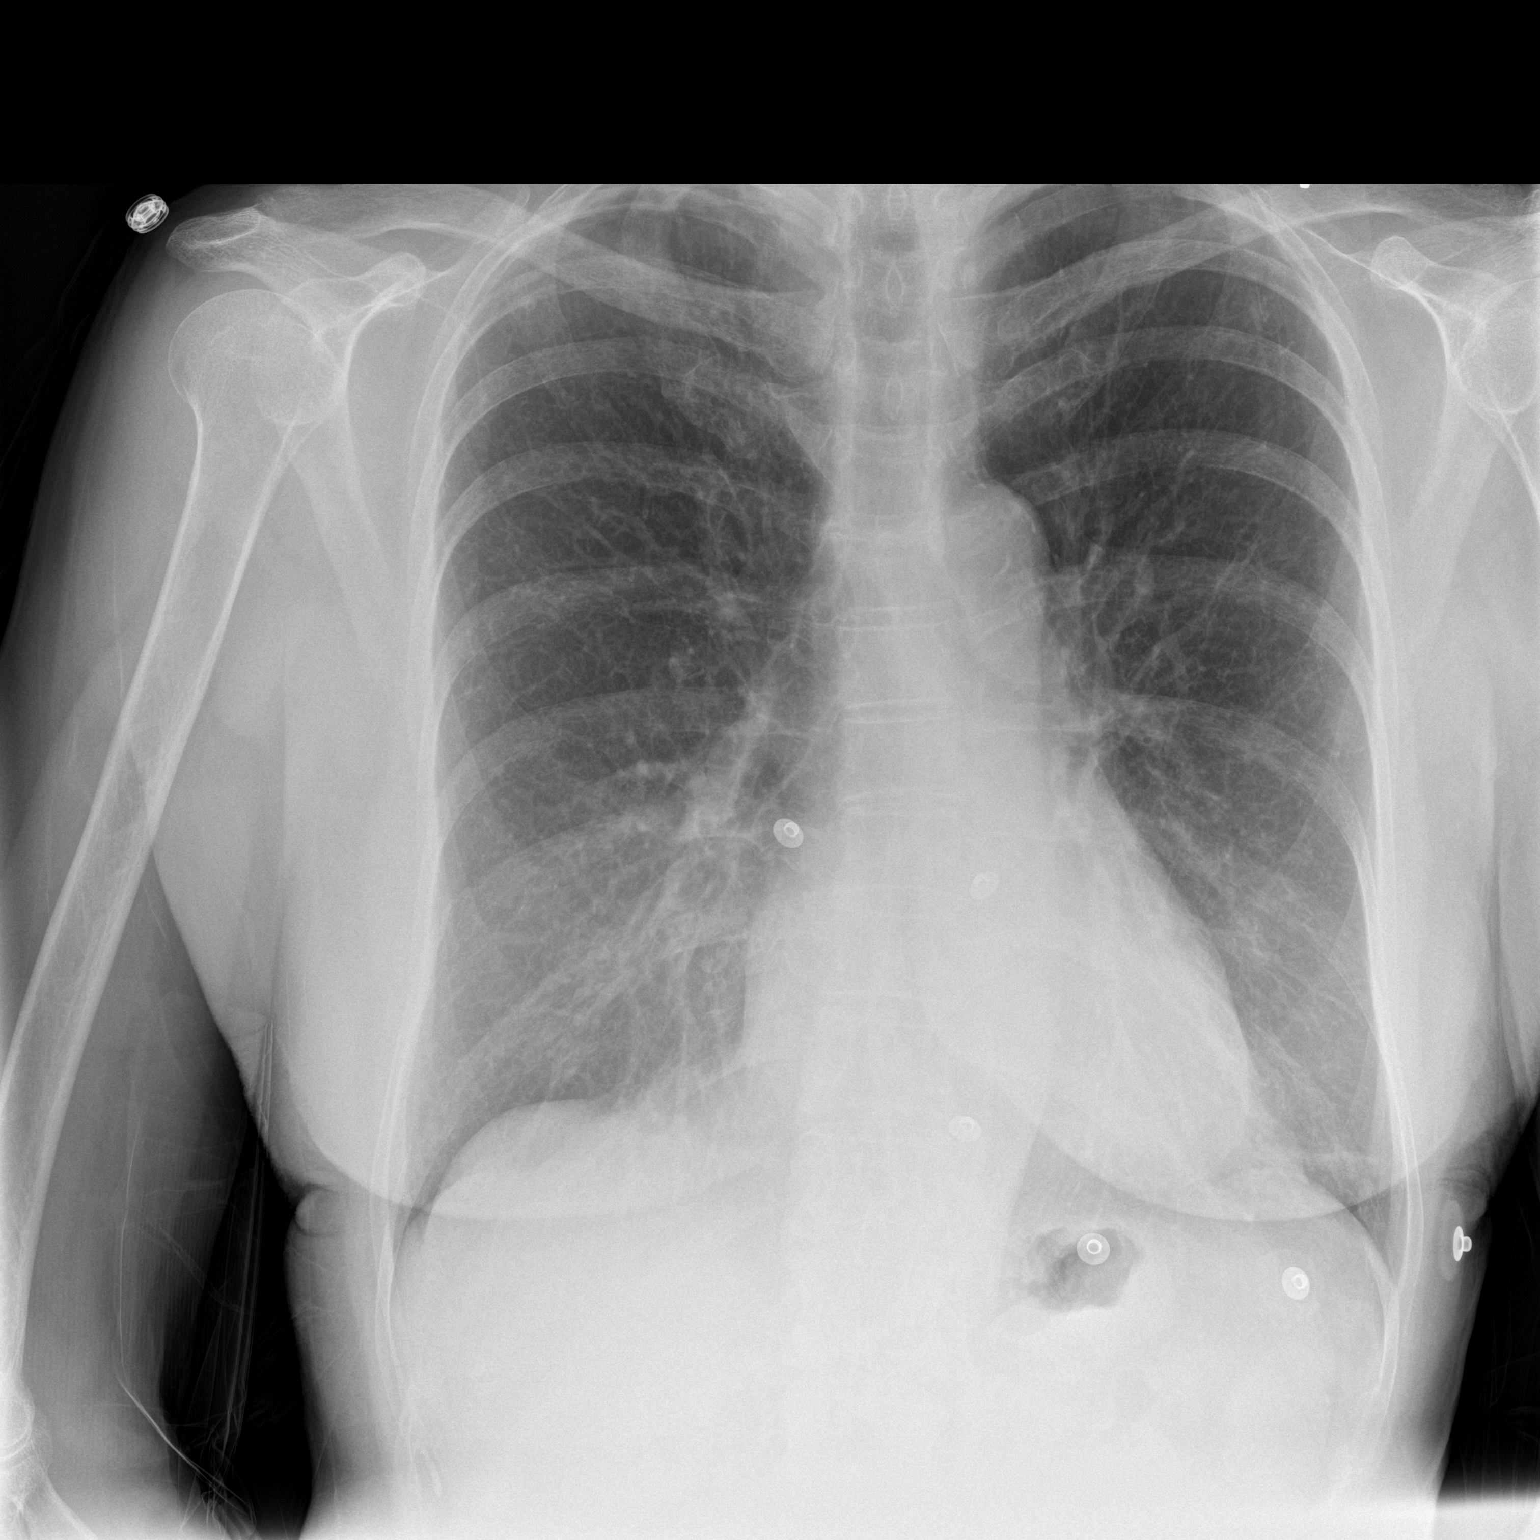

[chest lat]
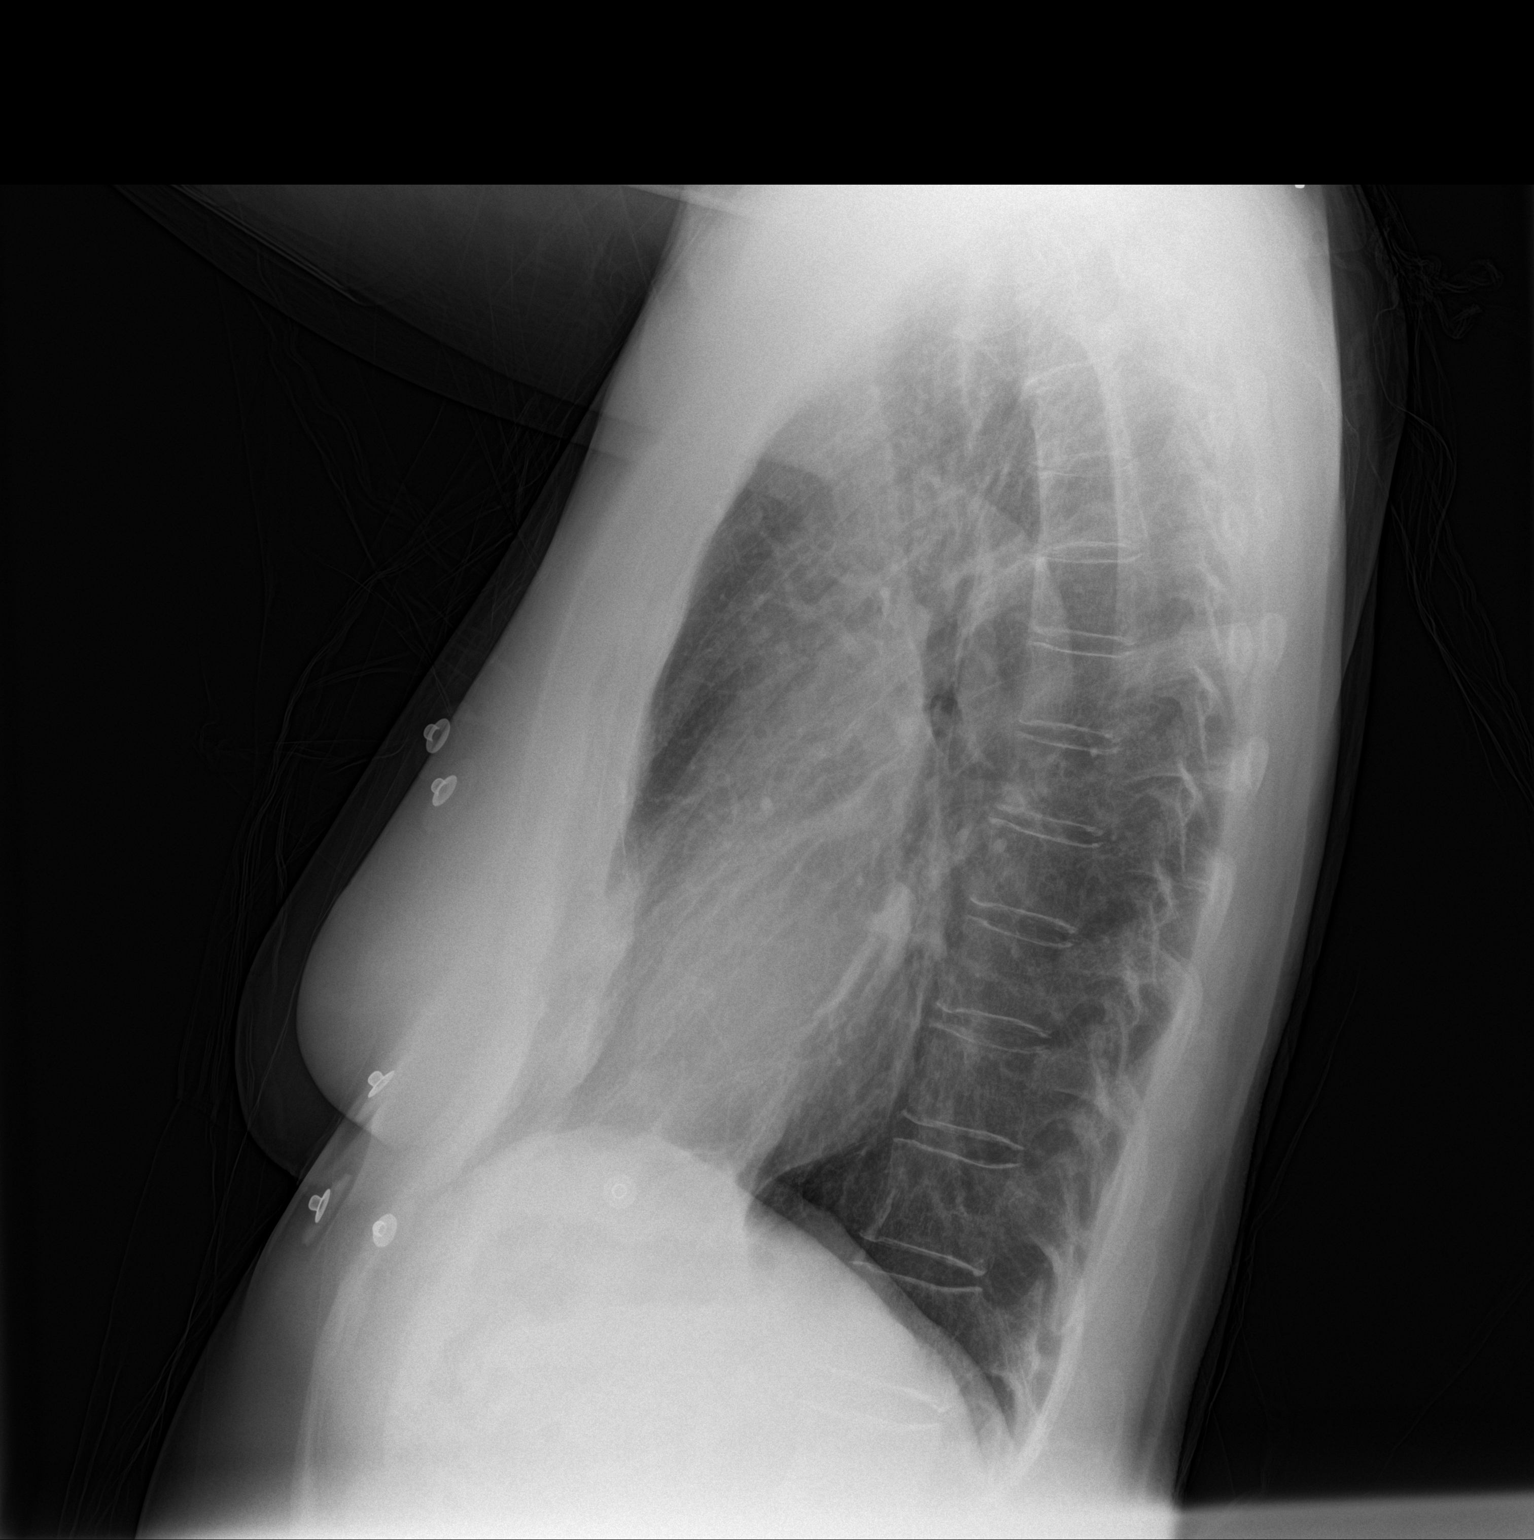

[2 of 2 positions shown; findings below may reference images not displayed]

FINDINGS: Heart size is normal. Calcified plaque noted within the aortic arch.
No pleural effusion or edema. The lungs are hyperinflated and there
are coarsened interstitial markings noted bilaterally. No airspace
consolidation identified. The visualized osseous structures are
unremarkable.
IMPRESSION: No active cardiopulmonary disease.

Atherosclerosis noted

## 2016-05-23 DIAGNOSIS — Z1231 Encounter for screening mammogram for malignant neoplasm of breast: Secondary | ICD-10-CM | POA: Diagnosis not present

## 2016-10-31 DIAGNOSIS — M810 Age-related osteoporosis without current pathological fracture: Secondary | ICD-10-CM | POA: Diagnosis not present

## 2016-10-31 DIAGNOSIS — Z23 Encounter for immunization: Secondary | ICD-10-CM | POA: Diagnosis not present

## 2016-10-31 DIAGNOSIS — E7800 Pure hypercholesterolemia, unspecified: Secondary | ICD-10-CM | POA: Diagnosis not present

## 2016-10-31 DIAGNOSIS — Z0000 Encounter for general adult medical examination without abnormal findings: Secondary | ICD-10-CM | POA: Diagnosis not present

## 2016-10-31 DIAGNOSIS — F39 Unspecified mood [affective] disorder: Secondary | ICD-10-CM | POA: Diagnosis not present

## 2016-12-12 DIAGNOSIS — M810 Age-related osteoporosis without current pathological fracture: Secondary | ICD-10-CM | POA: Diagnosis not present

## 2017-02-13 DIAGNOSIS — H25093 Other age-related incipient cataract, bilateral: Secondary | ICD-10-CM | POA: Diagnosis not present

## 2017-04-11 DIAGNOSIS — E7800 Pure hypercholesterolemia, unspecified: Secondary | ICD-10-CM | POA: Diagnosis not present

## 2017-04-11 DIAGNOSIS — R7301 Impaired fasting glucose: Secondary | ICD-10-CM | POA: Diagnosis not present

## 2017-06-28 DIAGNOSIS — L700 Acne vulgaris: Secondary | ICD-10-CM | POA: Diagnosis not present

## 2017-06-28 DIAGNOSIS — Z23 Encounter for immunization: Secondary | ICD-10-CM | POA: Diagnosis not present

## 2017-07-17 DIAGNOSIS — Z1211 Encounter for screening for malignant neoplasm of colon: Secondary | ICD-10-CM | POA: Diagnosis not present

## 2017-07-17 DIAGNOSIS — K6289 Other specified diseases of anus and rectum: Secondary | ICD-10-CM | POA: Diagnosis not present

## 2017-07-17 DIAGNOSIS — K5730 Diverticulosis of large intestine without perforation or abscess without bleeding: Secondary | ICD-10-CM | POA: Diagnosis not present

## 2017-08-17 DIAGNOSIS — C44311 Basal cell carcinoma of skin of nose: Secondary | ICD-10-CM | POA: Diagnosis not present

## 2017-08-17 DIAGNOSIS — D485 Neoplasm of uncertain behavior of skin: Secondary | ICD-10-CM | POA: Diagnosis not present

## 2017-09-18 DIAGNOSIS — C44311 Basal cell carcinoma of skin of nose: Secondary | ICD-10-CM | POA: Diagnosis not present

## 2017-09-18 DIAGNOSIS — Z85828 Personal history of other malignant neoplasm of skin: Secondary | ICD-10-CM | POA: Diagnosis not present

## 2017-11-02 DIAGNOSIS — Z0000 Encounter for general adult medical examination without abnormal findings: Secondary | ICD-10-CM | POA: Diagnosis not present

## 2017-11-02 DIAGNOSIS — Z01419 Encounter for gynecological examination (general) (routine) without abnormal findings: Secondary | ICD-10-CM | POA: Diagnosis not present

## 2017-11-02 DIAGNOSIS — M810 Age-related osteoporosis without current pathological fracture: Secondary | ICD-10-CM | POA: Diagnosis not present

## 2017-11-02 DIAGNOSIS — Z1389 Encounter for screening for other disorder: Secondary | ICD-10-CM | POA: Diagnosis not present

## 2017-11-02 DIAGNOSIS — E7800 Pure hypercholesterolemia, unspecified: Secondary | ICD-10-CM | POA: Diagnosis not present

## 2017-11-02 DIAGNOSIS — F39 Unspecified mood [affective] disorder: Secondary | ICD-10-CM | POA: Diagnosis not present

## 2018-03-16 DIAGNOSIS — H52223 Regular astigmatism, bilateral: Secondary | ICD-10-CM | POA: Diagnosis not present

## 2018-03-16 DIAGNOSIS — H524 Presbyopia: Secondary | ICD-10-CM | POA: Diagnosis not present

## 2018-03-16 DIAGNOSIS — H25093 Other age-related incipient cataract, bilateral: Secondary | ICD-10-CM | POA: Diagnosis not present

## 2018-03-16 DIAGNOSIS — H5203 Hypermetropia, bilateral: Secondary | ICD-10-CM | POA: Diagnosis not present

## 2018-06-07 DIAGNOSIS — Z1231 Encounter for screening mammogram for malignant neoplasm of breast: Secondary | ICD-10-CM | POA: Diagnosis not present

## 2018-06-26 DIAGNOSIS — C44722 Squamous cell carcinoma of skin of right lower limb, including hip: Secondary | ICD-10-CM | POA: Diagnosis not present

## 2018-06-26 DIAGNOSIS — D485 Neoplasm of uncertain behavior of skin: Secondary | ICD-10-CM | POA: Diagnosis not present

## 2018-06-26 DIAGNOSIS — Z85828 Personal history of other malignant neoplasm of skin: Secondary | ICD-10-CM | POA: Diagnosis not present

## 2018-07-05 ENCOUNTER — Ambulatory Visit (INDEPENDENT_AMBULATORY_CARE_PROVIDER_SITE_OTHER): Payer: Medicare Other | Admitting: Podiatry

## 2018-07-05 ENCOUNTER — Ambulatory Visit (INDEPENDENT_AMBULATORY_CARE_PROVIDER_SITE_OTHER): Payer: Medicare Other

## 2018-07-05 ENCOUNTER — Encounter: Payer: Self-pay | Admitting: Podiatry

## 2018-07-05 VITALS — BP 127/74 | HR 86 | Resp 16

## 2018-07-05 DIAGNOSIS — M779 Enthesopathy, unspecified: Secondary | ICD-10-CM

## 2018-07-05 DIAGNOSIS — Q828 Other specified congenital malformations of skin: Secondary | ICD-10-CM | POA: Diagnosis not present

## 2018-07-05 DIAGNOSIS — M778 Other enthesopathies, not elsewhere classified: Secondary | ICD-10-CM

## 2018-07-05 NOTE — Progress Notes (Signed)
  Subjective:  Patient ID: Kirsten Nash, female    DOB: 1941/04/10,  MRN: [id number] HPI Chief Complaint  Patient presents with  . Foot Pain    Plantar forefoot left - aching x few months, noticed knot this week when walking, injury as a child (cut foot with a scar), no treatment  . New Patient (Initial Visit)    77 y.o. female presents with the above complaint.   ROS: Denies fever chills nausea vomiting muscle aches pains calf pain back pain chest pain shortness of breath.  Past Medical History:  Diagnosis Date  . Abnormal EKG    ,normal cardiolyte   . Allergy-induced asthma   . Anxiety   . GERD (gastroesophageal reflux disease)   . Hyperlipidemia   . 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 Insomnia   . Osteopenia   . PVC's (premature ventricular contractions)    a. Holter 08/2013: NSR, sinus tach, occasional PVCs, rare PACs.   Past Surgical History:  Procedure Laterality Date  . TUBAL LIGATION     No current outpatient medications on file.  Allergies  Allergen Reactions  . Codeine Nausea Only   Review of Systems Objective:   Vitals:   07/05/18 1644  BP: 127/74  Pulse: 86  Resp: 16    General: Well developed, nourished, in no acute distress, alert and oriented x3   Dermatological: Skin is warm, dry and supple bilateral. Nails x 10 are well maintained; remaining integument appears unremarkable at this time. There are no open sores, no preulcerative lesions, no rash or signs of infection present.  Vascular: Dorsalis Pedis artery and Posterior Tibial artery pedal pulses are 2/4 bilateral with immedate capillary fill time. Pedal hair growth present. No varicosities and no lower extremity edema present bilateral.   Neruologic: Grossly intact via light touch bilateral. Vibratory intact via tuning fork bilateral. Protective threshold with Semmes Wienstein monofilament intact to all pedal sites bilateral. Patellar and Achilles  deep tendon reflexes 2+ bilateral. No Babinski or clonus noted bilateral.   Musculoskeletal: No gross boney pedal deformities bilateral. No pain, crepitus, or limitation noted with foot and ankle range of motion bilateral. Muscular strength 5/5 in all groups tested bilateral.  Gait: Unassisted, Nonantalgic.    Radiographs:  Radiographs taken today do not demonstrate any type of osseous abnormalities second metatarsal phalangeal joint area no acute findings.  Assessment & Plan:   Assessment: Discussed etiology pathology conservative surgical therapies at this point capsulitis second metatarsal phalangeal joint and porokeratotic lesion.  Plan: Discussed etiology pathology and surgical therapies debrided reactive hyperkeratotic tissue also injected 10 mg of Kenalog and local anesthetic to the plantar lateral aspect of the second metatarsal phalangeal joint of the left foot.     Kirsten Nash T. [place], [place]

## 2018-08-15 DIAGNOSIS — Z23 Encounter for immunization: Secondary | ICD-10-CM | POA: Diagnosis not present

## 2018-08-16 ENCOUNTER — Ambulatory Visit: Payer: Medicare Other | Admitting: Podiatry

## 2018-11-06 DIAGNOSIS — M810 Age-related osteoporosis without current pathological fracture: Secondary | ICD-10-CM | POA: Diagnosis not present

## 2018-11-06 DIAGNOSIS — K580 Irritable bowel syndrome with diarrhea: Secondary | ICD-10-CM | POA: Diagnosis not present

## 2018-11-06 DIAGNOSIS — F39 Unspecified mood [affective] disorder: Secondary | ICD-10-CM | POA: Diagnosis not present

## 2018-11-06 DIAGNOSIS — Z0000 Encounter for general adult medical examination without abnormal findings: Secondary | ICD-10-CM | POA: Diagnosis not present

## 2018-11-06 DIAGNOSIS — E7800 Pure hypercholesterolemia, unspecified: Secondary | ICD-10-CM | POA: Diagnosis not present

## 2018-11-06 DIAGNOSIS — I493 Ventricular premature depolarization: Secondary | ICD-10-CM | POA: Diagnosis not present

## 2019-03-19 DIAGNOSIS — H52223 Regular astigmatism, bilateral: Secondary | ICD-10-CM | POA: Diagnosis not present

## 2019-03-19 DIAGNOSIS — H25093 Other age-related incipient cataract, bilateral: Secondary | ICD-10-CM | POA: Diagnosis not present

## 2019-03-19 DIAGNOSIS — H5203 Hypermetropia, bilateral: Secondary | ICD-10-CM | POA: Diagnosis not present

## 2019-03-19 DIAGNOSIS — H524 Presbyopia: Secondary | ICD-10-CM | POA: Diagnosis not present

## 2019-04-05 DIAGNOSIS — H00013 Hordeolum externum right eye, unspecified eyelid: Secondary | ICD-10-CM | POA: Diagnosis not present

## 2019-04-24 DIAGNOSIS — C44519 Basal cell carcinoma of skin of other part of trunk: Secondary | ICD-10-CM | POA: Diagnosis not present

## 2019-04-24 DIAGNOSIS — D485 Neoplasm of uncertain behavior of skin: Secondary | ICD-10-CM | POA: Diagnosis not present

## 2019-04-24 DIAGNOSIS — Z85828 Personal history of other malignant neoplasm of skin: Secondary | ICD-10-CM | POA: Diagnosis not present

## 2019-05-02 DIAGNOSIS — C44519 Basal cell carcinoma of skin of other part of trunk: Secondary | ICD-10-CM | POA: Diagnosis not present

## 2019-06-24 DIAGNOSIS — Z23 Encounter for immunization: Secondary | ICD-10-CM | POA: Diagnosis not present

## 2019-10-15 DIAGNOSIS — D485 Neoplasm of uncertain behavior of skin: Secondary | ICD-10-CM | POA: Diagnosis not present

## 2019-10-15 DIAGNOSIS — Z85828 Personal history of other malignant neoplasm of skin: Secondary | ICD-10-CM | POA: Diagnosis not present

## 2019-10-15 DIAGNOSIS — C44529 Squamous cell carcinoma of skin of other part of trunk: Secondary | ICD-10-CM | POA: Diagnosis not present

## 2019-10-15 DIAGNOSIS — L570 Actinic keratosis: Secondary | ICD-10-CM | POA: Diagnosis not present

## 2019-10-31 DIAGNOSIS — C44529 Squamous cell carcinoma of skin of other part of trunk: Secondary | ICD-10-CM | POA: Diagnosis not present

## 2019-11-07 DIAGNOSIS — I493 Ventricular premature depolarization: Secondary | ICD-10-CM | POA: Diagnosis not present

## 2019-11-07 DIAGNOSIS — Z0000 Encounter for general adult medical examination without abnormal findings: Secondary | ICD-10-CM | POA: Diagnosis not present

## 2019-11-07 DIAGNOSIS — M810 Age-related osteoporosis without current pathological fracture: Secondary | ICD-10-CM | POA: Diagnosis not present

## 2019-11-07 DIAGNOSIS — K580 Irritable bowel syndrome with diarrhea: Secondary | ICD-10-CM | POA: Diagnosis not present

## 2019-11-07 DIAGNOSIS — C4490 Unspecified malignant neoplasm of skin, unspecified: Secondary | ICD-10-CM | POA: Diagnosis not present

## 2019-11-07 DIAGNOSIS — F39 Unspecified mood [affective] disorder: Secondary | ICD-10-CM | POA: Diagnosis not present

## 2019-11-07 DIAGNOSIS — Z1389 Encounter for screening for other disorder: Secondary | ICD-10-CM | POA: Diagnosis not present

## 2019-11-07 DIAGNOSIS — E7800 Pure hypercholesterolemia, unspecified: Secondary | ICD-10-CM | POA: Diagnosis not present

## 2019-12-23 DIAGNOSIS — Z1231 Encounter for screening mammogram for malignant neoplasm of breast: Secondary | ICD-10-CM | POA: Diagnosis not present

## 2019-12-23 DIAGNOSIS — M810 Age-related osteoporosis without current pathological fracture: Secondary | ICD-10-CM | POA: Diagnosis not present

## 2019-12-23 DIAGNOSIS — M8588 Other specified disorders of bone density and structure, other site: Secondary | ICD-10-CM | POA: Diagnosis not present

## 2020-03-24 DIAGNOSIS — H25013 Cortical age-related cataract, bilateral: Secondary | ICD-10-CM | POA: Diagnosis not present

## 2020-03-24 DIAGNOSIS — H2512 Age-related nuclear cataract, left eye: Secondary | ICD-10-CM | POA: Diagnosis not present

## 2020-03-24 DIAGNOSIS — H25043 Posterior subcapsular polar age-related cataract, bilateral: Secondary | ICD-10-CM | POA: Diagnosis not present

## 2020-03-24 DIAGNOSIS — H2513 Age-related nuclear cataract, bilateral: Secondary | ICD-10-CM | POA: Diagnosis not present

## 2020-03-24 DIAGNOSIS — H18413 Arcus senilis, bilateral: Secondary | ICD-10-CM | POA: Diagnosis not present

## 2020-05-08 DIAGNOSIS — L820 Inflamed seborrheic keratosis: Secondary | ICD-10-CM | POA: Diagnosis not present

## 2020-05-08 DIAGNOSIS — D485 Neoplasm of uncertain behavior of skin: Secondary | ICD-10-CM | POA: Diagnosis not present

## 2020-05-08 DIAGNOSIS — Z85828 Personal history of other malignant neoplasm of skin: Secondary | ICD-10-CM | POA: Diagnosis not present

## 2020-06-30 DIAGNOSIS — Z23 Encounter for immunization: Secondary | ICD-10-CM | POA: Diagnosis not present

## 2020-07-10 DIAGNOSIS — H2511 Age-related nuclear cataract, right eye: Secondary | ICD-10-CM | POA: Diagnosis not present

## 2020-07-10 DIAGNOSIS — Z961 Presence of intraocular lens: Secondary | ICD-10-CM | POA: Diagnosis not present

## 2020-07-10 DIAGNOSIS — H2512 Age-related nuclear cataract, left eye: Secondary | ICD-10-CM | POA: Diagnosis not present

## 2020-07-10 DIAGNOSIS — H52222 Regular astigmatism, left eye: Secondary | ICD-10-CM | POA: Diagnosis not present

## 2020-07-24 DIAGNOSIS — H2511 Age-related nuclear cataract, right eye: Secondary | ICD-10-CM | POA: Diagnosis not present

## 2020-07-24 DIAGNOSIS — H2512 Age-related nuclear cataract, left eye: Secondary | ICD-10-CM | POA: Diagnosis not present

## 2020-07-24 DIAGNOSIS — H52223 Regular astigmatism, bilateral: Secondary | ICD-10-CM | POA: Diagnosis not present

## 2020-07-24 DIAGNOSIS — Z961 Presence of intraocular lens: Secondary | ICD-10-CM | POA: Diagnosis not present

## 2020-07-24 DIAGNOSIS — H524 Presbyopia: Secondary | ICD-10-CM | POA: Diagnosis not present

## 2020-07-24 DIAGNOSIS — H5211 Myopia, right eye: Secondary | ICD-10-CM | POA: Diagnosis not present

## 2020-09-30 DIAGNOSIS — Z23 Encounter for immunization: Secondary | ICD-10-CM | POA: Diagnosis not present

## 2020-11-17 DIAGNOSIS — K580 Irritable bowel syndrome with diarrhea: Secondary | ICD-10-CM | POA: Diagnosis not present

## 2020-11-17 DIAGNOSIS — I493 Ventricular premature depolarization: Secondary | ICD-10-CM | POA: Diagnosis not present

## 2020-11-17 DIAGNOSIS — F39 Unspecified mood [affective] disorder: Secondary | ICD-10-CM | POA: Diagnosis not present

## 2020-11-17 DIAGNOSIS — M810 Age-related osteoporosis without current pathological fracture: Secondary | ICD-10-CM | POA: Diagnosis not present

## 2020-11-17 DIAGNOSIS — Z0000 Encounter for general adult medical examination without abnormal findings: Secondary | ICD-10-CM | POA: Diagnosis not present

## 2020-11-17 DIAGNOSIS — Z1389 Encounter for screening for other disorder: Secondary | ICD-10-CM | POA: Diagnosis not present

## 2020-11-17 DIAGNOSIS — E7800 Pure hypercholesterolemia, unspecified: Secondary | ICD-10-CM | POA: Diagnosis not present

## 2021-08-04 DIAGNOSIS — Z23 Encounter for immunization: Secondary | ICD-10-CM | POA: Diagnosis not present

## 2021-08-17 DIAGNOSIS — E7800 Pure hypercholesterolemia, unspecified: Secondary | ICD-10-CM | POA: Diagnosis not present

## 2021-09-30 DIAGNOSIS — H52223 Regular astigmatism, bilateral: Secondary | ICD-10-CM | POA: Diagnosis not present

## 2021-09-30 DIAGNOSIS — Z961 Presence of intraocular lens: Secondary | ICD-10-CM | POA: Diagnosis not present

## 2021-09-30 DIAGNOSIS — H5213 Myopia, bilateral: Secondary | ICD-10-CM | POA: Diagnosis not present

## 2021-09-30 DIAGNOSIS — H524 Presbyopia: Secondary | ICD-10-CM | POA: Diagnosis not present

## 2021-11-26 DIAGNOSIS — E7800 Pure hypercholesterolemia, unspecified: Secondary | ICD-10-CM | POA: Diagnosis not present

## 2021-11-26 DIAGNOSIS — M810 Age-related osteoporosis without current pathological fracture: Secondary | ICD-10-CM | POA: Diagnosis not present

## 2021-11-26 DIAGNOSIS — Z0000 Encounter for general adult medical examination without abnormal findings: Secondary | ICD-10-CM | POA: Diagnosis not present

## 2021-11-26 DIAGNOSIS — C4490 Unspecified malignant neoplasm of skin, unspecified: Secondary | ICD-10-CM | POA: Diagnosis not present

## 2021-11-26 DIAGNOSIS — I493 Ventricular premature depolarization: Secondary | ICD-10-CM | POA: Diagnosis not present

## 2021-11-26 DIAGNOSIS — Z1389 Encounter for screening for other disorder: Secondary | ICD-10-CM | POA: Diagnosis not present

## 2021-11-26 DIAGNOSIS — F39 Unspecified mood [affective] disorder: Secondary | ICD-10-CM | POA: Diagnosis not present

## 2021-12-02 DIAGNOSIS — Z1231 Encounter for screening mammogram for malignant neoplasm of breast: Secondary | ICD-10-CM | POA: Diagnosis not present

## 2021-12-03 DIAGNOSIS — H53129 Transient visual loss, unspecified eye: Secondary | ICD-10-CM | POA: Diagnosis not present

## 2021-12-24 DIAGNOSIS — M810 Age-related osteoporosis without current pathological fracture: Secondary | ICD-10-CM | POA: Diagnosis not present

## 2021-12-24 DIAGNOSIS — M8588 Other specified disorders of bone density and structure, other site: Secondary | ICD-10-CM | POA: Diagnosis not present

## 2022-04-01 DIAGNOSIS — I471 Supraventricular tachycardia: Secondary | ICD-10-CM | POA: Diagnosis not present

## 2022-04-01 DIAGNOSIS — R002 Palpitations: Secondary | ICD-10-CM | POA: Diagnosis not present

## 2022-04-13 ENCOUNTER — Other Ambulatory Visit: Payer: Self-pay

## 2022-04-13 ENCOUNTER — Ambulatory Visit
Admission: RE | Admit: 2022-04-13 | Discharge: 2022-04-13 | Disposition: A | Discharge status: Home / Self Care | Payer: Medicare Other | Source: Ambulatory Visit | Attending: Family Medicine | Admitting: Family Medicine

## 2022-04-13 VITALS — BP 115/68 | HR 100 | Temp 98.3°F | Resp 16

## 2022-04-13 DIAGNOSIS — H9201 Otalgia, right ear: Secondary | ICD-10-CM | POA: Diagnosis not present

## 2022-04-13 DIAGNOSIS — J028 Acute pharyngitis due to other specified organisms: Secondary | ICD-10-CM | POA: Insufficient documentation

## 2022-04-13 DIAGNOSIS — B9789 Other viral agents as the cause of diseases classified elsewhere: Secondary | ICD-10-CM | POA: Insufficient documentation

## 2022-04-13 DIAGNOSIS — Z20822 Contact with and (suspected) exposure to covid-19: Secondary | ICD-10-CM | POA: Diagnosis not present

## 2022-04-13 LAB — RESP PANEL BY RT-PCR (FLU A&B, COVID) ARPGX2
Influenza A by PCR: NEGATIVE
Influenza B by PCR: NEGATIVE
SARS Coronavirus 2 by RT PCR: NEGATIVE

## 2022-04-13 LAB — POCT RAPID STREP A (OFFICE): Rapid Strep A Screen: NEGATIVE

## 2022-04-13 NOTE — ED Triage Notes (Signed)
Pt reports sore throat started yesterday and Rt ear pain started today.

## 2022-04-13 NOTE — ED Provider Notes (Signed)
[facility]    CSN: [id number] Arrival date & time: 04/13/22  1347      History   Chief Complaint Chief Complaint  Patient presents with   Sore Throat    sore throat with e ache - Entered by patient   Otalgia    HPI Kirsten Nash is a 81 y.o. female.   HPI Patient with sore throat and right ear pain presents today for evaluation. Symptoms present since yesterday. She denies any associated symptoms of nasal congestion, cough, wheezing, or chest pain.  She has not taken any medication for her symptoms. Denies any known exposures to anyone who's recently been sick. 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 Holter 08/2013: NSR, sinus tach, occasional PVCs, rare PACs.    Patient Active Problem List   Diagnosis Date Noted   Vasovagal attack 09/12/2014   PVC (premature ventricular contraction) 09/12/2014   Incomplete right bundle branch block (RBBB) with left anterior fascicular block    PVC's (premature ventricular contractions)    Hyperlipidemia     Past Surgical History:  Procedure Laterality Date   TUBAL LIGATION      OB History   No obstetric history on file.      Home Medications    Prior to Admission medications   Not on File    Family History Family History  Problem Relation Age of Onset   Heart attack Mother 75       In her early 78's    Social History Social History   Tobacco Use   Smoking status: Never   Smokeless tobacco: Never  Substance Use Topics   Alcohol use: Yes    Comment: Occassionally   Drug use: No     Allergies   Codeine   Review of Systems Review of Systems Pertinent negatives listed in HPI   Physical Exam Triage  Vital Signs ED Triage Vitals [04/13/22 1414]  Enc Vitals Group     BP 115/68     Pulse Rate 100     Resp 16     Temp 98.3 F (36.8 C)     Temp Source Oral     SpO2 95 %     Weight      Height      Head Circumference      Peak Flow      Pain Score      Pain Loc      Pain Edu?      Excl. in [place]?    No data found.  Updated Vital Signs BP 115/68 (BP Location: Left Arm)   Pulse 100   Temp 98.3 F (36.8 C) (Oral)   Resp 16   SpO2 95%   Visual Acuity Right Eye Distance:   Left Eye Distance:   Bilateral Distance:    Right Eye Near:   Left Eye Near:    Bilateral Near:     Physical Exam Vitals reviewed.  Constitutional:      Appearance: She is well-developed.  HENT:     Head: Normocephalic and atraumatic.     Right Ear: Tympanic membrane and ear canal normal.  Left Ear: A middle ear effusion is present.     Nose: No congestion or rhinorrhea.     Mouth/Throat:     Mouth: Mucous membranes are moist.     Pharynx: Posterior oropharyngeal erythema and uvula swelling present.     Tonsils: No tonsillar exudate or tonsillar abscesses.  Eyes:     Conjunctiva/sclera: Conjunctivae normal.     Pupils: Pupils are equal, round, and reactive to light.  Cardiovascular:     Rate and Rhythm: Normal rate and regular rhythm.  Pulmonary:     Effort: Pulmonary effort is normal.     Breath sounds: Normal breath sounds.  Skin:    General: Skin is warm and dry.     Capillary Refill: Capillary refill takes less than 2 seconds.  Neurological:     General: No focal deficit present.     Mental Status: She is alert and oriented to person, place, and time.  Psychiatric:        Mood and Affect: Mood normal.        Behavior: Behavior normal.      UC Treatments / Results  Labs (all labs ordered are listed, but only abnormal results are displayed) Labs Reviewed  RESP PANEL BY RT-PCR (FLU A&B, COVID) ARPGX2  CULTURE, GROUP A STREP [facility])  POCT RAPID STREP A (OFFICE)     EKG   Radiology No results found.  Procedures Procedures (including critical care time)  Medications Ordered in UC Medications - No data to display  Initial Impression / Assessment and Plan / UC Course  I have reviewed the triage vital signs and the nursing notes.  Pertinent labs & imaging results that were available during my care of the patient were reviewed by me and considered in my medical decision making (see chart for details).    Rapid strep negative. Throat Culture pending. COVID/FLU pending. Discussed the option of antivirals if positive. Tamiflu if flu positive and Molnupiravir if COVID positive, patient will decide when results are available. Continue symptom management per discharge medication instructions. Patient verbalized understanding and agreement with plan. Final Clinical Impressions(s) / UC Diagnoses   Final diagnoses:  Sore throat (viral)  Right ear pain  Encounter for laboratory testing for COVID-19 virus     Discharge Instructions      Rapid strep was negative. Your COVID/FLU/Strep test is pending. The COVID/Flu test will result directly to your MyChart after 10 pm tonight. If either test is positive, you can request antiviral treatment.  As discussed for now, symptom management for viral symptoms. For ear pain , tylenol and OTC Flonase.   For treatment of sore or scratchy throat, use a saltwater gargle-  to  teaspoon of salt dissolved in a 4-ounce to 8-ounce glass of warm water.  Gargle the solution for approximately 15-30 seconds and then spit.  It is important not to swallow the solution.  You can also use throat lozenges/cough drops and Chloraseptic spray to help with throat pain or discomfort.  Warm or cold liquids can also be helpful in relieving throat pain.      ED Prescriptions   None    PDMP not reviewed this encounter.   Scot Jun, FNP 04/13/22 1538

## 2022-04-13 NOTE — Discharge Instructions (Addendum)
Rapid strep was negative. Your COVID/FLU/Strep test is pending. The COVID/Flu test will result directly to your MyChart after 10 pm tonight. If either test is positive, you can request antiviral treatment.  As discussed for now, symptom management for viral symptoms. For ear pain , tylenol and OTC Flonase.   For treatment of sore or scratchy throat, use a saltwater gargle-  to  teaspoon of salt dissolved in a 4-ounce to 8-ounce glass of warm water.  Gargle the solution for approximately 15-30 seconds and then spit.  It is important not to swallow the solution.  You can also use throat lozenges/cough drops and Chloraseptic spray to help with throat pain or discomfort.  Warm or cold liquids can also be helpful in relieving throat pain.

## 2022-04-15 LAB — CULTURE, GROUP A STREP (THRC)

## 2022-04-16 LAB — CULTURE, GROUP A STREP (THRC)

## 2022-04-21 DIAGNOSIS — R002 Palpitations: Secondary | ICD-10-CM | POA: Diagnosis not present

## 2022-04-22 DIAGNOSIS — R002 Palpitations: Secondary | ICD-10-CM | POA: Diagnosis not present

## 2022-04-22 DIAGNOSIS — I471 Supraventricular tachycardia: Secondary | ICD-10-CM | POA: Diagnosis not present

## 2022-07-06 DIAGNOSIS — L570 Actinic keratosis: Secondary | ICD-10-CM | POA: Diagnosis not present

## 2022-07-06 DIAGNOSIS — L820 Inflamed seborrheic keratosis: Secondary | ICD-10-CM | POA: Diagnosis not present

## 2022-07-06 DIAGNOSIS — L821 Other seborrheic keratosis: Secondary | ICD-10-CM | POA: Diagnosis not present

## 2022-07-06 DIAGNOSIS — D0472 Carcinoma in situ of skin of left lower limb, including hip: Secondary | ICD-10-CM | POA: Diagnosis not present

## 2022-07-06 DIAGNOSIS — Z85828 Personal history of other malignant neoplasm of skin: Secondary | ICD-10-CM | POA: Diagnosis not present

## 2022-07-06 DIAGNOSIS — D485 Neoplasm of uncertain behavior of skin: Secondary | ICD-10-CM | POA: Diagnosis not present

## 2022-07-14 DIAGNOSIS — Z23 Encounter for immunization: Secondary | ICD-10-CM | POA: Diagnosis not present

## 2022-10-26 DIAGNOSIS — H04123 Dry eye syndrome of bilateral lacrimal glands: Secondary | ICD-10-CM | POA: Diagnosis not present

## 2022-10-26 DIAGNOSIS — H5213 Myopia, bilateral: Secondary | ICD-10-CM | POA: Diagnosis not present

## 2022-10-26 DIAGNOSIS — Z961 Presence of intraocular lens: Secondary | ICD-10-CM | POA: Diagnosis not present

## 2022-12-08 DIAGNOSIS — C4490 Unspecified malignant neoplasm of skin, unspecified: Secondary | ICD-10-CM | POA: Diagnosis not present

## 2022-12-08 DIAGNOSIS — F39 Unspecified mood [affective] disorder: Secondary | ICD-10-CM | POA: Diagnosis not present

## 2022-12-08 DIAGNOSIS — F5101 Primary insomnia: Secondary | ICD-10-CM | POA: Diagnosis not present

## 2022-12-08 DIAGNOSIS — M810 Age-related osteoporosis without current pathological fracture: Secondary | ICD-10-CM | POA: Diagnosis not present

## 2022-12-08 DIAGNOSIS — I493 Ventricular premature depolarization: Secondary | ICD-10-CM | POA: Diagnosis not present

## 2022-12-08 DIAGNOSIS — E7800 Pure hypercholesterolemia, unspecified: Secondary | ICD-10-CM | POA: Diagnosis not present

## 2022-12-08 DIAGNOSIS — Z0000 Encounter for general adult medical examination without abnormal findings: Secondary | ICD-10-CM | POA: Diagnosis not present

## 2022-12-28 ENCOUNTER — Ambulatory Visit: Payer: Medicare Other | Admitting: Cardiology

## 2023-07-18 DIAGNOSIS — Z23 Encounter for immunization: Secondary | ICD-10-CM | POA: Diagnosis not present

## 2023-12-22 ENCOUNTER — Telehealth: Payer: Self-pay | Admitting: Nurse Practitioner

## 2023-12-22 NOTE — Telephone Encounter (Unsigned)
 Copied from CRM [id number]. Topic: Appointments - Appointment Scheduling >> Dec 22, 2023 11:02 AM Keitha Pata L wrote: Patient/patient representative is calling to schedule an appointment. Refer to attachments for appointment information.

## 2024-01-30 ENCOUNTER — Ambulatory Visit: Attending: Cardiology | Admitting: Cardiology

## 2024-01-30 ENCOUNTER — Encounter: Payer: Self-pay | Admitting: Cardiology

## 2024-01-30 VITALS — BP 132/68 | HR 98 | Ht 66.6 in | Wt 131.8 lb

## 2024-01-30 DIAGNOSIS — I493 Ventricular premature depolarization: Secondary | ICD-10-CM | POA: Insufficient documentation

## 2024-01-30 DIAGNOSIS — I452 Bifascicular block: Secondary | ICD-10-CM | POA: Diagnosis not present

## 2024-01-30 DIAGNOSIS — R9431 Abnormal electrocardiogram [ECG] [EKG]: Secondary | ICD-10-CM | POA: Diagnosis not present

## 2024-01-30 DIAGNOSIS — I444 Left anterior fascicular block: Secondary | ICD-10-CM | POA: Insufficient documentation

## 2024-01-30 NOTE — Progress Notes (Signed)
 Cardiology Office Note:  .   Date:  01/30/2024  ID:  Kirsten Nash, DOB 1941-08-22, MRN [id number] PCP: Elester Grim, MD  [facility] Providers Cardiologist:  None     History of Present Illness: .   Kirsten Nash is a 83 y.o. female Discussed the use of AI scribe software for clinical note transcription with the patient, who gave verbal consent to proceed.  History of Present Illness Kirsten Nash is an 83 year old female who presents with palpitations and associated anxiety.  She experiences palpitations described as a 'flip flop' sensation in her heart, which have been present for several years. A heart monitor worn two years ago showed premature beats but no atrial fibrillation. Despite reassurance, she continues to feel anxious when these palpitations occur.  She was prescribed buspirone for anxiety, which she took for about a year and a half, but it did not alleviate her symptoms and worsened her insomnia. She experiences breathlessness and increased anxiety when her heart 'feels funny' or races. No chest pain or fainting episodes, although she feels lightheaded when bending over and standing up.  Her past medical history includes a previous prescription of metoprolol , which caused significant hypotension and fainting. She currently takes magnesium and zinc with calcium supplements and walks two miles most days, which she finds beneficial. She also has a prescription for Zobroderm, but it causes depression.  Family history is significant for her mother having had a stroke, which contributes to her anxiety about her heart condition.     Studies Reviewed: Aaron Aas    EKG Interpretation Date/Time:  Tuesday Jan 30 2024 15:12:53 EDT Ventricular Rate:  92 PR Interval:  166 QRS Duration:  88 QT Interval:  356 QTC Calculation: 440 R Axis:   -80  Text Interpretation: Normal sinus rhythm Biatrial enlargement Left anterior fascicular block Cannot rule out  Anterior infarct , age undetermined When compared with ECG of 28-Jul-2014 20:59, No significant change since last tracing Confirmed by Dorothye Gathers ([id number]) on 01/30/2024 3:20:49 PM    Results DIAGNOSTIC EKG: No major changes; few conduction abnormalities; no atrial fibrillation (01/30/2024) Heart monitor: Premature beats; no atrial fibrillation (2023)  Risk Assessment/Calculations:            Physical Exam:   VS:  BP 132/68   Pulse 98   Ht 5' 6.6" (1.692 m)   Wt 131 lb 12.8 oz (59.8 kg)   SpO2 97%   BMI 20.89 kg/m    Wt Readings from Last 3 Encounters:  01/30/24 131 lb 12.8 oz (59.8 kg)  09/12/14 133 lb (60.3 kg)  07/30/14 134 lb 6.4 oz (61 kg)    GEN: Well nourished, well developed in no acute distress NECK: No JVD; No carotid bruits CARDIAC: RRR, no murmurs, no rubs, no gallops RESPIRATORY:  Clear to auscultation without rales, wheezing or rhonchi  ABDOMEN: Soft, non-tender, non-distended EXTREMITIES:  No edema; No deformity   ASSESSMENT AND PLAN: .    Assessment and Plan Assessment & Plan Premature heartbeats (PAC, PVC's) Premature heartbeats with associated anxiety and breathlessness. EKG and previous heart monitor show no atrial fibrillation. Symptoms include cardiac awareness and anxiety. Premature beats are benign and not a precursor to serious cardiac events. Discussed potential causes of anxiety related to heartbeats and vice versa. Reassured that premature beats are not harmful and do not lead to stroke. Discussed the option of using a smartwatch or KardiaMobile for monitoring, but she declined due to potential anxiety. - Order  echocardiogram to assess heart structure and function - Encourage over-the-counter magnesium supplementation - Advise on dietary potassium intake, such as bananas - Discussed medication options including metoprolol  (prior hypotension - fainting), diltiazem , and flecainide, but decided against due to previous adverse effects and her preference  to avoid medications, agree. - Encourage daily physical activity, such as walking two miles a day - Provide reassurance regarding the benign nature of premature beats  Anxiety Anxiety associated with premature heartbeats. Previous treatment with buspirone was ineffective and worsened insomnia. Anxiety may be exacerbated by cardiac awareness and vice versa. Discussed the cyclical nature of anxiety and heart symptoms. Reassured that premature beats are not harmful. She prefers to avoid medications for anxiety management. - Encourage good sleep hygiene and regular physical activity         Dispo: PRN. Will follow up with ECHO.  Signed, Dorothye Gathers, MD

## 2024-01-30 NOTE — Patient Instructions (Addendum)
 Medication Instructions:  The current medical regimen is effective;  continue present plan and medications.  *If you need a refill on your cardiac medications before your next appointment, please call your pharmacy*  Testing/Procedures: Your physician has requested that you have an echocardiogram. Echocardiography is a painless test that uses sound waves to create images of your heart. It provides your doctor with information about the size and shape of your heart and how well your heart's chambers and valves are working. This procedure takes approximately one hour. There are no restrictions for this procedure. Please do NOT wear cologne, perfume, aftershave, or lotions (deodorant is allowed). Please arrive 15 minutes prior to your appointment time.  Please note: We ask at that you not bring children with you during ultrasound (echo/ vascular) testing. Due to room size and safety concerns, children are not allowed in the ultrasound rooms during exams. Our front office staff cannot provide observation of children in our lobby area while testing is being conducted. An adult accompanying a patient to their appointment will only be allowed in the ultrasound room at the discretion of the ultrasound technician under special circumstances. We apologize for any inconvenience.  Follow-Up: At [facility], you and your health needs are our priority.  As part of our continuing mission to provide you with exceptional heart care, our providers are all part of one team.  This team includes your primary Cardiologist (physician) and Advanced Practice Providers or APPs (Physician Assistants and Nurse Practitioners) who all work together to provide you with the care you need, when you need it.  Your next appointment:   Follow up as needed with Dr Renna Cary  We recommend signing up for the patient portal called "MyChart".  Sign up information is provided on this After Visit Summary.  MyChart is used to connect  with patients for Virtual Visits (Telemedicine).  Patients are able to view lab/test results, encounter notes, upcoming appointments, etc.  Non-urgent messages can be sent to your provider as well.   To learn more about what you can do with MyChart, go to [email or web address].

## 2024-03-12 ENCOUNTER — Ambulatory Visit (HOSPITAL_COMMUNITY)
Admission: RE | Admit: 2024-03-12 | Discharge: 2024-03-12 | Disposition: A | Discharge status: Home / Self Care | Source: Ambulatory Visit | Attending: Cardiology | Admitting: Cardiology

## 2024-03-12 DIAGNOSIS — I452 Bifascicular block: Secondary | ICD-10-CM | POA: Insufficient documentation

## 2024-03-12 DIAGNOSIS — R9431 Abnormal electrocardiogram [ECG] [EKG]: Secondary | ICD-10-CM | POA: Insufficient documentation

## 2024-03-12 LAB — ECHOCARDIOGRAM COMPLETE
Area-P 1/2: 3.94 cm2
S' Lateral: 2.8 cm

## 2024-03-14 ENCOUNTER — Ambulatory Visit: Payer: Self-pay | Admitting: Cardiology

## 2024-11-29 ENCOUNTER — Ambulatory Visit: Admitting: Nurse Practitioner
